# Patient Record
Sex: Female | Born: 1950 | Race: White | Hispanic: No | Marital: Married | Smoking: Never smoker
Health system: Southern US, Community
[De-identification: ages and names within clinical notes are randomized; demographics above are authoritative.]

## PROBLEM LIST (undated history)

## (undated) DIAGNOSIS — N3020 Other chronic cystitis without hematuria: Secondary | ICD-10-CM

## (undated) DIAGNOSIS — K219 Gastro-esophageal reflux disease without esophagitis: Secondary | ICD-10-CM

## (undated) DIAGNOSIS — I8390 Asymptomatic varicose veins of unspecified lower extremity: Secondary | ICD-10-CM

## (undated) DIAGNOSIS — K5790 Diverticulosis of intestine, part unspecified, without perforation or abscess without bleeding: Secondary | ICD-10-CM

## (undated) DIAGNOSIS — L659 Nonscarring hair loss, unspecified: Secondary | ICD-10-CM

## (undated) DIAGNOSIS — G35 Multiple sclerosis: Secondary | ICD-10-CM

## (undated) DIAGNOSIS — I10 Essential (primary) hypertension: Secondary | ICD-10-CM

## (undated) DIAGNOSIS — M1990 Unspecified osteoarthritis, unspecified site: Secondary | ICD-10-CM

## (undated) HISTORY — DX: Gastro-esophageal reflux disease without esophagitis: K21.9

## (undated) HISTORY — DX: Essential (primary) hypertension: I10

## (undated) HISTORY — DX: Asymptomatic varicose veins of unspecified lower extremity: I83.90

## (undated) HISTORY — PX: SHOULDER SURGERY: SHX246

## (undated) HISTORY — DX: Other chronic cystitis without hematuria: N30.20

## (undated) HISTORY — DX: Multiple sclerosis: G35

## (undated) HISTORY — DX: Nonscarring hair loss, unspecified: L65.9

## (undated) HISTORY — DX: Diverticulosis of intestine, part unspecified, without perforation or abscess without bleeding: K57.90

---

## 1975-02-03 HISTORY — PX: TUBAL LIGATION: SHX77

## 1985-02-02 HISTORY — PX: CHOLECYSTECTOMY: SHX55

## 1992-02-03 HISTORY — PX: ABDOMINAL HYSTERECTOMY: SHX81

## 1998-09-10 ENCOUNTER — Other Ambulatory Visit: Admission: RE | Admit: 1998-09-10 | Discharge: 1998-09-10 | Payer: Self-pay | Admitting: Obstetrics and Gynecology

## 2000-01-15 ENCOUNTER — Other Ambulatory Visit: Admission: RE | Admit: 2000-01-15 | Discharge: 2000-01-15 | Payer: Self-pay | Admitting: Obstetrics and Gynecology

## 2000-02-03 HISTORY — PX: FRACTURE SURGERY: SHX138

## 2001-06-15 ENCOUNTER — Other Ambulatory Visit: Admission: RE | Admit: 2001-06-15 | Discharge: 2001-06-15 | Payer: Self-pay | Admitting: Obstetrics and Gynecology

## 2002-02-02 HISTORY — PX: OTHER SURGICAL HISTORY: SHX169

## 2002-07-05 ENCOUNTER — Other Ambulatory Visit: Admission: RE | Admit: 2002-07-05 | Discharge: 2002-07-05 | Payer: Self-pay | Admitting: Obstetrics and Gynecology

## 2003-11-06 ENCOUNTER — Ambulatory Visit (HOSPITAL_COMMUNITY): Admission: RE | Admit: 2003-11-06 | Discharge: 2003-11-06 | Payer: Self-pay | Admitting: Obstetrics and Gynecology

## 2009-02-15 ENCOUNTER — Ambulatory Visit: Payer: Self-pay | Admitting: Genetic Counselor

## 2009-03-18 ENCOUNTER — Ambulatory Visit: Payer: Self-pay | Admitting: Genetic Counselor

## 2009-08-02 ENCOUNTER — Ambulatory Visit: Payer: Self-pay | Admitting: Genetic Counselor

## 2011-02-07 DIAGNOSIS — R197 Diarrhea, unspecified: Secondary | ICD-10-CM | POA: Diagnosis not present

## 2011-02-07 DIAGNOSIS — R112 Nausea with vomiting, unspecified: Secondary | ICD-10-CM | POA: Diagnosis not present

## 2011-02-07 DIAGNOSIS — N390 Urinary tract infection, site not specified: Secondary | ICD-10-CM | POA: Diagnosis not present

## 2011-02-07 DIAGNOSIS — R109 Unspecified abdominal pain: Secondary | ICD-10-CM | POA: Diagnosis not present

## 2011-02-07 DIAGNOSIS — G35 Multiple sclerosis: Secondary | ICD-10-CM | POA: Diagnosis not present

## 2011-02-07 DIAGNOSIS — R51 Headache: Secondary | ICD-10-CM | POA: Diagnosis not present

## 2011-02-07 DIAGNOSIS — E785 Hyperlipidemia, unspecified: Secondary | ICD-10-CM | POA: Diagnosis not present

## 2011-04-06 DIAGNOSIS — G35 Multiple sclerosis: Secondary | ICD-10-CM | POA: Diagnosis not present

## 2012-06-24 ENCOUNTER — Encounter: Payer: Self-pay | Admitting: Surgery

## 2012-06-24 ENCOUNTER — Other Ambulatory Visit: Payer: Self-pay | Admitting: *Deleted

## 2012-06-24 DIAGNOSIS — I83893 Varicose veins of bilateral lower extremities with other complications: Secondary | ICD-10-CM

## 2012-07-13 DIAGNOSIS — I10 Essential (primary) hypertension: Secondary | ICD-10-CM | POA: Diagnosis not present

## 2012-07-13 DIAGNOSIS — G35 Multiple sclerosis: Secondary | ICD-10-CM | POA: Diagnosis not present

## 2012-07-13 DIAGNOSIS — Z006 Encounter for examination for normal comparison and control in clinical research program: Secondary | ICD-10-CM | POA: Diagnosis not present

## 2012-08-08 ENCOUNTER — Encounter: Payer: Self-pay | Admitting: Surgery

## 2012-08-26 ENCOUNTER — Encounter: Payer: Self-pay | Admitting: Surgery

## 2012-08-29 ENCOUNTER — Encounter: Payer: Self-pay | Admitting: Surgery

## 2012-08-29 ENCOUNTER — Encounter (INDEPENDENT_AMBULATORY_CARE_PROVIDER_SITE_OTHER): Payer: Medicare Other | Admitting: *Deleted

## 2012-08-29 ENCOUNTER — Ambulatory Visit (INDEPENDENT_AMBULATORY_CARE_PROVIDER_SITE_OTHER): Payer: Medicare Other | Admitting: Surgery

## 2012-08-29 VITALS — BP 118/70 | HR 65 | Resp 20 | Ht 65.5 in | Wt 211.2 lb

## 2012-08-29 DIAGNOSIS — I83893 Varicose veins of bilateral lower extremities with other complications: Secondary | ICD-10-CM

## 2012-08-29 NOTE — Progress Notes (Signed)
[facility]   Patient name: Carol Green MRN: [id number] DOB: 21-Sep-1950 Sex: female   Referred by: Self  Reason for referral:  Chief Complaint  Patient presents with  . New Evaluation    c/o varicose veins with leg pain ; left worse than right    HISTORY OF PRESENT ILLNESS: This is a 62 year old female who comes in today at the request of Dr. Welton Flakes for evaluation of bilateral leg pain, left greater than right. She states that this is been going on for approximately one year and getting worse. She states that her legs feel very heavy and that they do not hurt with standing. Her symptoms are improved by sitting down and elevating her legs. She has noticed more prominent bulging veins on her leg. She is prone to falls secondary to her am asked and she is afraid of bleeding. She does complain of some swelling with prolonged standing. She denies a history of DVT or PE.  Past Medical History  Diagnosis Date  . Multiple sclerosis   . Hypertension   . Acid reflux   . Diverticulosis   . Chronic cystitis   . Alopecia   . Varicose veins     Past Surgical History  Procedure Laterality Date  . Cholecystectomy  1987  . Abdominal hysterectomy  1994  . Shoulder surgery Bilateral 2003 - right; 2008 - left  . Other surgical history Left 2004    removal fatty tumor left arm  . Tubal ligation  1977  . Fracture surgery Left 2002    History   Social History  . Marital Status: Single    Spouse Name: N/A    Number of Children: N/A  . Years of Education: N/A   Occupational History  . Not on file.   Social History Main Topics  . Smoking status: Never Smoker   . Smokeless tobacco: Not on file  . Alcohol Use: No  . Drug Use: No  . Sexually Active: Not on file   Other Topics Concern  . Not on file   Social History Narrative  . No narrative on file    Family History  Problem Relation Age of Onset  . Cancer Mother     ovarian  . Other Father    varicose veins  . Deep vein thrombosis Father   . Cancer Sister     breast  . Deep vein thrombosis Brother   . Other Brother     varicose veins  . Diabetes Sister   . Deep vein thrombosis Sister   . Hypertension Sister   . Hyperlipidemia Sister   . Other Sister     varicose veins    Allergies as of 08/29/2012 - Review Complete 08/29/2012  Allergen Reaction Noted  . Codeine  06/24/2012    Current Outpatient Prescriptions on File Prior to Visit  Medication Sig Dispense Refill  . amantadine (SYMMETREL) 100 MG capsule Take 100 mg by mouth 3 (three) times daily.      Marland Kitchen aspirin 81 MG tablet Take 81 mg by mouth. Take 2 tablets daily      . Calcium Carbonate Antacid (TUMS PO) Take by mouth daily.      Marland Kitchen gabapentin (NEURONTIN) 300 MG capsule Take 600 mg by mouth 4 (four) times daily.       Marland Kitchen lisinopril-hydrochlorothiazide (PRINZIDE,ZESTORETIC) 10-12.5 MG per tablet Take 1 tablet by mouth daily.      . Multiple Vitamins-Minerals (MULTIPLE VITAMINS/WOMENS PO) Take by mouth  daily.      . omeprazole (PRILOSEC) 20 MG capsule Take 20 mg by mouth daily.      . solifenacin (VESICARE) 5 MG tablet Take 5 mg by mouth daily.       . temazepam (RESTORIL) 15 MG capsule Take 15 mg by mouth at bedtime as needed for sleep.      . Vitamins C E (CRANBERRY CONCENTRATE PO) Take 100 mg by mouth 2 (two) times daily.        No current facility-administered medications on file prior to visit.     REVIEW OF SYSTEMS: Cardiovascular: Positive for pain in legs with walking and when lying flat. Positive for leg swelling and varicose veins Pulmonary: No productive cough, asthma or wheezing. Neurologic: Positive for weakness in arms and legs, numbness in her arms and legs, difficulty speaking and slurred speech, dizziness Hematologic: No bleeding problems or clotting disorders. Musculoskeletal: No joint pain or joint swelling. Gastrointestinal: No blood in stool or hematemesis Genitourinary: No dysuria or  hematuria. Psychiatric:: No history of major depression. Integumentary: No rashes or ulcers. Constitutional: No fever or chills.  PHYSICAL EXAMINATION: General: The patient appears their stated age.  Vital signs are BP 118/70  Pulse 65  Resp 20  Ht 5' 5.5" (1.664 m)  Wt 211 lb 3.2 oz (95.8 kg)  BMI 34.6 kg/m2 HEENT:  No gross abnormalities Pulmonary: Respirations are non-labored Musculoskeletal: There are no major deformities.   Neurologic: No focal weakness or paresthesias are detected, Skin: There are no ulcer or rashes noted. Psychiatric: The patient has normal affect. Cardiovascular: Palpable pedal pulses. Prominent varicosities in the left leg anteriorly around the knee and thigh as well as laterally  Diagnostic Studies: Duplex ultrasound was ordered and reviewed. This shows no significant reflux in the right leg however rouloux formation is noted. The left leg there is reflux in the great saphenous vein. Vein measurements are 0.45 and greater. There is no deep reflux bilaterally   Assessment:  Venous insufficiency, left leg Plan: The patient has symptoms of pain and mild swelling in her left leg which were secondary to her venous insufficiency. She is very concerned that the varicosities on her legs are getting bigger and that she may fall and because these to start bleeding secondary to her and asked. I'm going to place her in 2 thigh-high 20-30 mm compression stockings today impression her back in 3 months for possible laser ablation of the left great saphenous vein with stab phlebectomy of the varicosities.     Jorge Ny, M.D. [facility] [place] Office: [phone] Pager:  [phone]

## 2012-11-29 ENCOUNTER — Encounter: Payer: Self-pay | Admitting: Vascular Surgery

## 2012-11-29 ENCOUNTER — Ambulatory Visit (INDEPENDENT_AMBULATORY_CARE_PROVIDER_SITE_OTHER): Payer: Medicare Other | Admitting: Vascular Surgery

## 2012-11-29 VITALS — BP 136/65 | HR 82 | Ht 65.5 in | Wt 212.0 lb

## 2012-11-29 DIAGNOSIS — I83893 Varicose veins of bilateral lower extremities with other complications: Secondary | ICD-10-CM

## 2012-11-29 NOTE — Progress Notes (Signed)
Subjective:     Patient ID: Carol Green, female   DOB: 1950/11/04, 62 y.o.   MRN: [id number]  HPI this 62 year old female returns for further discussion regarding her severe varicose veins in the left leg. These cause pain including aching throbbing and burning discomfort. She has been trying long-leg elastic compression stockings 20-30 mm gradient as well as elevation and ibuprofen with no success. She has no history of DVT or thrombophlebitis. He has no history of stasis ulcers or bleeding.  Past Medical History  Diagnosis Date  . Multiple sclerosis   . Hypertension   . Acid reflux   . Diverticulosis   . Chronic cystitis   . Alopecia   . Varicose veins     History  Substance Use Topics  . Smoking status: Never Smoker   . Smokeless tobacco: Not on file  . Alcohol Use: No    Family History  Problem Relation Age of Onset  . Cancer Mother     ovarian  . Other Father     varicose veins  . Deep vein thrombosis Father   . Cancer Sister     breast  . Deep vein thrombosis Brother   . Other Brother     varicose veins  . Diabetes Sister   . Deep vein thrombosis Sister   . Hypertension Sister   . Hyperlipidemia Sister   . Other Sister     varicose veins    Allergies  Allergen Reactions  . Codeine     Current outpatient prescriptions:amantadine (SYMMETREL) 100 MG capsule, Take 100 mg by mouth 3 (three) times daily., Disp: , Rfl: ;  aspirin 81 MG tablet, Take 81 mg by mouth. Take 2 tablets daily, Disp: , Rfl: ;  Calcium Carbonate Antacid (TUMS PO), Take by mouth daily., Disp: , Rfl: ;  cetirizine (ZYRTEC) 10 MG tablet, Take 10 mg by mouth daily., Disp: , Rfl:  cholecalciferol (VITAMIN D) 1000 UNITS tablet, Take 1,000 Units by mouth daily., Disp: , Rfl: ;  Fingolimod HCl 0.5 MG CAPS, Take 1 capsule by mouth daily., Disp: , Rfl: ;  gabapentin (NEURONTIN) 300 MG capsule, Take 600 mg by mouth 4 (four) times daily. , Disp: , Rfl: ;  lisinopril-hydrochlorothiazide  (PRINZIDE,ZESTORETIC) 10-12.5 MG per tablet, Take 1 tablet by mouth daily., Disp: , Rfl:  Multiple Vitamins-Minerals (MULTIPLE VITAMINS/WOMENS PO), Take by mouth daily., Disp: , Rfl: ;  omeprazole (PRILOSEC) 20 MG capsule, Take 20 mg by mouth daily., Disp: , Rfl: ;  temazepam (RESTORIL) 15 MG capsule, Take 15 mg by mouth at bedtime as needed for sleep., Disp: , Rfl: ;  Vitamins C E (CRANBERRY CONCENTRATE PO), Take 100 mg by mouth 2 (two) times daily. , Disp: , Rfl:  celecoxib (CELEBREX) 200 MG capsule, Take 200 mg by mouth daily., Disp: , Rfl: ;  promethazine (PHENERGAN) 12.5 MG tablet, Take 12.5 mg by mouth every 6 (six) hours as needed for nausea., Disp: , Rfl: ;  solifenacin (VESICARE) 5 MG tablet, Take 5 mg by mouth daily. , Disp: , Rfl:   BP 136/65  Pulse 82  Ht 5' 5.5" (1.664 m)  Wt 212 lb (96.163 kg)  BMI 34.73 kg/m2  SpO2 95%  Body mass index is 34.73 kg/(m^2).           Review of Systems denies chest pain, dyspnea on exertion, PND, orthopnea, hemoptysis. Does complain of weakness in her legs, numbness in her legs, dizziness, and occasional slurred speech.     Objective:  Physical Exam BP 136/65  Pulse 82  Ht 5' 5.5" (1.664 m)  Wt 212 lb (96.163 kg)  BMI 34.73 kg/m2  SpO2 95%  General well-developed well-nourished female in no apparent distress alert and oriented x3 Left leg with bulging varicosities in the distal anterior medial thigh extending lateral to the patella and into the lateral calf. No active ulcers noted distally. 3+ dorsalis pedis pulse palpable distally.     Assessment:     Severe venous insufficiency left leg with bulging painful varicosities not relieved by long-leg elastic compression stockings, elevation, and ibuprofen.-Affecting patient's daily living and becoming worse patient needs #1 laser ablation left great saphenous vein. We'll then wait 3 months to see if stab phlebectomy of secondary painful varicosities will be indicated. Will proceed with  precertification to perform this in the near future     Plan:

## 2012-12-27 ENCOUNTER — Other Ambulatory Visit: Payer: Self-pay | Admitting: *Deleted

## 2012-12-27 DIAGNOSIS — I83893 Varicose veins of bilateral lower extremities with other complications: Secondary | ICD-10-CM

## 2013-02-06 ENCOUNTER — Other Ambulatory Visit: Payer: Medicare Other | Admitting: Vascular Surgery

## 2013-02-13 ENCOUNTER — Ambulatory Visit: Payer: Medicare Other | Admitting: Vascular Surgery

## 2013-02-13 ENCOUNTER — Encounter (HOSPITAL_COMMUNITY): Payer: Medicare Other

## 2013-03-03 ENCOUNTER — Encounter: Payer: Self-pay | Admitting: Vascular Surgery

## 2013-03-06 ENCOUNTER — Ambulatory Visit (INDEPENDENT_AMBULATORY_CARE_PROVIDER_SITE_OTHER): Payer: Medicare Other | Admitting: Vascular Surgery

## 2013-03-06 ENCOUNTER — Encounter: Payer: Self-pay | Admitting: Vascular Surgery

## 2013-03-06 VITALS — BP 152/95 | HR 90 | Resp 16 | Ht 65.5 in | Wt 213.0 lb

## 2013-03-06 DIAGNOSIS — I83893 Varicose veins of bilateral lower extremities with other complications: Secondary | ICD-10-CM

## 2013-03-06 NOTE — Progress Notes (Signed)
Subjective:     Patient ID: Carol Green, female   DOB: July 16, 1950, 63 y.o.   MRN: [id number]  HPI this 63 year old female had laser ablation left great saphenous vein from the proximal calf to near the saphenofemoral junction performed under local tumescent anesthesia for painful varicosities. She tolerated the procedure well. A total of 2264 J of energy was utilized   Review of Systems     Objective:   Physical Exam BP 152/95  Pulse 90  Resp 16  Ht 5' 5.5" (1.664 m)  Wt 213 lb (96.616 kg)  BMI 34.89 kg/m2       Assessment:     Well-tolerated laser ablation left great saphenous vein performed under local tumescent anesthesia for painful varicosities    Plan:     Return in one week for a venous duplex exam to confirm closure left great saphenous vein

## 2013-03-06 NOTE — Progress Notes (Signed)
   Laser Ablation Procedure      Date: 03/06/2013    Carol Green DOB:11-03-1950  Consent signed: Yes  Surgeon:J.D. Kellie Simmering  Procedure: Laser Ablation: left Greater Saphenous Vein  BP 152/95  Pulse 90  Resp 16  Ht 5' 5.5" (1.664 m)  Wt 213 lb (96.616 kg)  BMI 34.89 kg/m2  Start time: 11   End time: 11:30  Tumescent Anesthesia: 400 cc 0.9% NaCl with 50 cc Lidocaine HCL with 1% Epi and 15 cc 8.4% NaHCO3  Local Anesthesia: 5 cc Lidocaine HCL and NaHCO3 (ratio 2:1)  Pulsed mode: 15 watts, 587ms delay, 1.0 duration Total energy: 2264, total pulses: 152, total time: 2:31     Patient tolerated procedure well: Yes  Notes:   Description of Procedure:  After marking the course of the saphenous vein and the secondary varicosities in the standing position, the patient was placed on the operating table in the supine position, and the left leg was prepped and draped in sterile fashion. Local anesthetic was administered, and under ultrasound guidance the saphenous vein was accessed with a micro needle and guide wire; then the micro puncture sheath was placed. A guide wire was inserted to the saphenofemoral junction, followed by a 5 french sheath.  The position of the sheath and then the laser fiber below the junction was confirmed using the ultrasound and visualization of the aiming beam.  Tumescent anesthesia was administered along the course of the saphenous vein using ultrasound guidance. Protective laser glasses were placed on the patient, and the laser was fired at 15 watt pulsed mode advancing 1-2 mm per sec.  For a total of 2264 joules.  A steri strip was applied to the puncture site.    ABD pads and thigh high compression stockings were applied.  Ace wrap bandages were applied over the phlebectomy sites and at the top of the saphenofemoral junction.  Blood loss was less than 15 cc.  The patient ambulated out of the operating room having tolerated the procedure well.

## 2013-03-07 ENCOUNTER — Encounter: Payer: Self-pay | Admitting: Vascular Surgery

## 2013-03-07 ENCOUNTER — Telehealth: Payer: Self-pay | Admitting: *Deleted

## 2013-03-07 NOTE — Telephone Encounter (Signed)
Pt. Doing well. No problems. Following all instructions.

## 2013-03-10 ENCOUNTER — Encounter: Payer: Self-pay | Admitting: Vascular Surgery

## 2013-03-13 ENCOUNTER — Ambulatory Visit (INDEPENDENT_AMBULATORY_CARE_PROVIDER_SITE_OTHER): Payer: Self-pay | Admitting: Vascular Surgery

## 2013-03-13 ENCOUNTER — Ambulatory Visit (HOSPITAL_COMMUNITY)
Admission: RE | Admit: 2013-03-13 | Discharge: 2013-03-13 | Disposition: A | Discharge status: Home / Self Care | Payer: Medicare Other | Source: Ambulatory Visit | Attending: Vascular Surgery | Admitting: Vascular Surgery

## 2013-03-13 ENCOUNTER — Encounter: Payer: Self-pay | Admitting: Vascular Surgery

## 2013-03-13 VITALS — BP 156/87 | HR 89 | Resp 16 | Ht 65.5 in | Wt 211.0 lb

## 2013-03-13 DIAGNOSIS — I83893 Varicose veins of bilateral lower extremities with other complications: Secondary | ICD-10-CM

## 2013-03-13 DIAGNOSIS — Z09 Encounter for follow-up examination after completed treatment for conditions other than malignant neoplasm: Secondary | ICD-10-CM | POA: Insufficient documentation

## 2013-03-13 NOTE — Progress Notes (Signed)
Subjective:     Patient ID: Carol Green, female   DOB: 05/06/1950, 63 y.o.   MRN: [id number]  HPI this 63 year old female returns 1 week post laser ablation left great saphenous vein for painful varicosities due to gross reflux in the left great saphenous system. She states that the residual varicosities or less noticeable following the procedure. She had some moderate discomfort initially which is now resolving in the thigh area where the ablation was performed. She is wearing elastic compression stockings and taking ibuprofen as instructed.  Past Medical History  Diagnosis Date  . Multiple sclerosis   . Hypertension   . Acid reflux   . Diverticulosis   . Chronic cystitis   . Alopecia   . Varicose veins     History  Substance Use Topics  . Smoking status: Never Smoker   . Smokeless tobacco: Not on file  . Alcohol Use: No    Family History  Problem Relation Age of Onset  . Cancer Mother     ovarian  . Other Father     varicose veins  . Deep vein thrombosis Father   . Cancer Sister     breast  . Deep vein thrombosis Brother   . Other Brother     varicose veins  . Diabetes Sister   . Deep vein thrombosis Sister   . Hypertension Sister   . Hyperlipidemia Sister   . Other Sister     varicose veins    Allergies  Allergen Reactions  . Codeine     Current outpatient prescriptions:amantadine (SYMMETREL) 100 MG capsule, Take 100 mg by mouth 3 (three) times daily., Disp: , Rfl: ;  aspirin 81 MG tablet, Take 81 mg by mouth. Take 2 tablets daily, Disp: , Rfl: ;  Calcium Carbonate Antacid (TUMS PO), Take by mouth daily., Disp: , Rfl: ;  celecoxib (CELEBREX) 200 MG capsule, Take 200 mg by mouth daily., Disp: , Rfl: ;  cetirizine (ZYRTEC) 10 MG tablet, Take 10 mg by mouth daily., Disp: , Rfl:  cholecalciferol (VITAMIN D) 1000 UNITS tablet, Take 1,000 Units by mouth daily., Disp: , Rfl: ;  Fingolimod HCl 0.5 MG CAPS, Take 1 capsule by mouth daily., Disp: , Rfl: ;  gabapentin  (NEURONTIN) 300 MG capsule, Take 600 mg by mouth 4 (four) times daily. , Disp: , Rfl: ;  lisinopril-hydrochlorothiazide (PRINZIDE,ZESTORETIC) 10-12.5 MG per tablet, Take 1 tablet by mouth daily., Disp: , Rfl:  Multiple Vitamins-Minerals (MULTIPLE VITAMINS/WOMENS PO), Take by mouth daily., Disp: , Rfl: ;  omeprazole (PRILOSEC) 20 MG capsule, Take 20 mg by mouth daily., Disp: , Rfl: ;  promethazine (PHENERGAN) 12.5 MG tablet, Take 12.5 mg by mouth every 6 (six) hours as needed for nausea., Disp: , Rfl: ;  solifenacin (VESICARE) 5 MG tablet, Take 5 mg by mouth daily. , Disp: , Rfl:  temazepam (RESTORIL) 15 MG capsule, Take 15 mg by mouth at bedtime as needed for sleep., Disp: , Rfl: ;  Vitamins C E (CRANBERRY CONCENTRATE PO), Take 100 mg by mouth 2 (two) times daily. , Disp: , Rfl:   BP 156/87  Pulse 89  Resp 16  Ht 5' 5.5" (1.664 m)  Wt 211 lb (95.709 kg)  BMI 34.57 kg/m2  Body mass index is 34.57 kg/(m^2).          Review of Systems denies chest pain, dyspnea on exertion, PND, orthopnea, hemoptysis, claudication    Objective:   Physical Exam BP 156/87  Pulse 89  Resp 16  Ht 5' 5.5" (1.664 m)  Wt 211 lb (95.709 kg)  BMI 34.57 kg/m2  General well-developed well-nourished female in no apparent stress alert and oriented x3 Lungs no rhonchi or wheezing Left leg with mild to moderate discomfort along coarser great saphenous vein from groin to distal thigh. There is a prominent varix across the patella and a few in the distal medial thigh area. 3 posterior cells pedis pulse palpable.  Today I ordered a venous duplex exam the left leg which are reviewed and interpreted. There is no DVT. The left great saphenous vein was closed from the proximal calf to near the saphenofemoral junction     Assessment:     Successful laser ablation left great saphenous vein for painful varicosities due to gross reflux left great saphenous system    Plan:     Return in 3 months to see if stab  phlebectomy or sclerotherapy will be needed

## 2013-03-15 ENCOUNTER — Other Ambulatory Visit: Payer: Self-pay | Admitting: Vascular Surgery

## 2013-03-15 DIAGNOSIS — I83893 Varicose veins of bilateral lower extremities with other complications: Secondary | ICD-10-CM

## 2013-06-19 ENCOUNTER — Encounter: Payer: Self-pay | Admitting: Vascular Surgery

## 2013-06-20 ENCOUNTER — Ambulatory Visit: Payer: Medicare Other | Admitting: Vascular Surgery

## 2013-06-23 ENCOUNTER — Encounter: Payer: Self-pay | Admitting: Vascular Surgery

## 2013-06-27 ENCOUNTER — Encounter: Payer: Self-pay | Admitting: Vascular Surgery

## 2013-06-27 ENCOUNTER — Ambulatory Visit (INDEPENDENT_AMBULATORY_CARE_PROVIDER_SITE_OTHER): Payer: Medicare Other | Admitting: Vascular Surgery

## 2013-06-27 VITALS — BP 139/65 | HR 84 | Resp 16 | Ht 65.5 in | Wt 212.0 lb

## 2013-06-27 DIAGNOSIS — I83893 Varicose veins of bilateral lower extremities with other complications: Secondary | ICD-10-CM

## 2013-06-27 NOTE — Progress Notes (Signed)
Subjective:     Patient ID: Carol Green, female   DOB: 03/07/1950, 63 y.o.   MRN: [id number]  HPI this 63 year old female returns 3 months post laser ablation left great saphenous vein for painful varicosities. She states that the swelling in the ankle it has disappeared and the pain in the bop varicosities is less but not resolved. She continues to have some residual varicosities in the lateral leg around the knee area. She has tried longer-lasting compression stockings and this has not relieved her discomfort.  Past Medical History  Diagnosis Date  . Multiple sclerosis   . Hypertension   . Acid reflux   . Diverticulosis   . Chronic cystitis   . Alopecia   . Varicose veins     History  Substance Use Topics  . Smoking status: Never Smoker   . Smokeless tobacco: Not on file  . Alcohol Use: No    Family History  Problem Relation Age of Onset  . Cancer Mother     ovarian  . Other Father     varicose veins  . Deep vein thrombosis Father   . Cancer Sister     breast  . Deep vein thrombosis Brother   . Other Brother     varicose veins  . Diabetes Sister   . Deep vein thrombosis Sister   . Hypertension Sister   . Hyperlipidemia Sister   . Other Sister     varicose veins    Allergies  Allergen Reactions  . Codeine     Current outpatient prescriptions:amantadine (SYMMETREL) 100 MG capsule, Take 100 mg by mouth 3 (three) times daily., Disp: , Rfl: ;  aspirin 81 MG tablet, Take 81 mg by mouth. Take 2 tablets daily, Disp: , Rfl: ;  Calcium Carbonate Antacid (TUMS PO), Take by mouth daily., Disp: , Rfl: ;  celecoxib (CELEBREX) 200 MG capsule, Take 200 mg by mouth daily., Disp: , Rfl: ;  cetirizine (ZYRTEC) 10 MG tablet, Take 10 mg by mouth daily., Disp: , Rfl:  cholecalciferol (VITAMIN D) 1000 UNITS tablet, Take 1,000 Units by mouth daily., Disp: , Rfl: ;  Fingolimod HCl 0.5 MG CAPS, Take 1 capsule by mouth daily., Disp: , Rfl: ;  gabapentin (NEURONTIN) 300 MG capsule, Take 600  mg by mouth 4 (four) times daily. , Disp: , Rfl: ;  lisinopril-hydrochlorothiazide (PRINZIDE,ZESTORETIC) 10-12.5 MG per tablet, Take 1 tablet by mouth daily., Disp: , Rfl:  Multiple Vitamins-Minerals (MULTIPLE VITAMINS/WOMENS PO), Take by mouth daily., Disp: , Rfl: ;  omeprazole (PRILOSEC) 20 MG capsule, Take 20 mg by mouth daily., Disp: , Rfl: ;  promethazine (PHENERGAN) 12.5 MG tablet, Take 12.5 mg by mouth every 6 (six) hours as needed for nausea., Disp: , Rfl: ;  solifenacin (VESICARE) 5 MG tablet, Take 5 mg by mouth daily. , Disp: , Rfl:  temazepam (RESTORIL) 15 MG capsule, Take 15 mg by mouth at bedtime as needed for sleep., Disp: , Rfl: ;  Vitamins C E (CRANBERRY CONCENTRATE PO), Take 100 mg by mouth 2 (two) times daily. , Disp: , Rfl:   BP 139/65  Pulse 84  Resp 16  Ht 5' 5.5" (1.664 m)  Wt 212 lb (96.163 kg)  BMI 34.73 kg/m2  Body mass index is 34.73 kg/(m^2).           Review of Systems denies chest pain, dyspnea on exertion, PND, orthopnea, hemoptysis.     Objective:   Physical Exam BP 139/65  Pulse 84  Resp 16  Ht 5' 5.5" (1.664 m)  Wt 212 lb (96.163 kg)  BMI 34.73 kg/m2  General well-developed well-nourished female no apparent stress alert and oriented x3 Lungs no rhonchi or wheezing Left leg with no distal edema noted. 3 posterior cells pedis pulse palpable. Prominent vein over Patel item pretibial region. Adiposities and distal thigh anteriorly and lateral thigh and lateral calf.      Assessment:     Painful varicosities left leg 3 months post left great saphenous vein laser ablation-successful    Plan:     Patient needs greater than 20 stab phlebectomy painful varicosities left leg is final procedure We'll proceed with precertification to perform this in the near future to relieve her pain

## 2013-07-14 ENCOUNTER — Encounter: Payer: Self-pay | Admitting: Vascular Surgery

## 2013-07-17 ENCOUNTER — Encounter: Payer: Self-pay | Admitting: Vascular Surgery

## 2013-07-17 ENCOUNTER — Ambulatory Visit (INDEPENDENT_AMBULATORY_CARE_PROVIDER_SITE_OTHER): Payer: Medicare Other | Admitting: Vascular Surgery

## 2013-07-17 VITALS — BP 126/79 | HR 85 | Resp 16 | Ht 65.5 in | Wt 212.0 lb

## 2013-07-17 DIAGNOSIS — I83893 Varicose veins of bilateral lower extremities with other complications: Secondary | ICD-10-CM

## 2013-07-17 NOTE — Progress Notes (Signed)
Subjective:     Patient ID: Carol Green, female   DOB: [birth year], 63 y.o.   MRN: [id number]  HPI this 63 year old female had multiple stab phlebectomy left leg secondary painful varicosities performed under local tumescent anesthesia. She tolerated surgery well.   Review of Systems     Objective:   Physical Exam BP 126/79  Pulse 85  Resp 16  Ht 5' 5.5" (1.664 m)  Wt 212 lb (96.163 kg)  BMI 34.73 kg/m2       Assessment:     Well-tolerated multiple stab phlebectomy (greater than 20) of painful varicosities performed under local tumescent anesthesia    Plan:     Return in 8 weeks for final followup

## 2013-07-17 NOTE — Progress Notes (Signed)
   Laser Ablation Procedure      Date: 07/17/2013    Carol Green DOB:[birth year]  Consent signed: Yes  Surgeon:J.D. Kellie Simmering  Procedure: : left Leg stab phlebectomies  BP 126/79  Pulse 85  Resp 16  Ht 5' 5.5" (1.664 m)  Wt 212 lb (96.163 kg)  BMI 34.73 kg/m2  Start time: 10:55   End time: 11:30  Tumescent Anesthesia: 200 cc 0.9% NaCl with 50 cc Lidocaine HCL with 1% Epi and 15 cc 8.4% NaHCO3  Local Anesthesia: 6 cc Lidocaine HCL and NaHCO3 (ratio 2:1)      Stab Phlebectomy: >20 Sites: Thigh, Calf and Ankle  Patient tolerated procedure well: Yes  Notes:   Description of Procedure:   The patient was then put into Trendelenburg position.  Local anesthetic was utilized overlying the marked varicosities.  Greater than 20 stab wounds were made using the tip of an 11 blade; and using the vein hook,  The phlebectomies were performed using a hemostat to avulse these varicosities.  Adequate hemostasis was achieved, and steri strips were applied to the stab wound.      ABD pads and thigh high compression stockings were applied.  Ace wrap bandages were applied over the phlebectomy sites.  Blood loss was less than 15 cc.  The patient ambulated out of the operating room having tolerated the procedure well.

## 2013-07-18 ENCOUNTER — Telehealth: Payer: Self-pay | Admitting: *Deleted

## 2013-07-18 NOTE — Telephone Encounter (Signed)
Pt doing well. No pain or bleeding. Following all instructions.

## 2013-09-08 ENCOUNTER — Encounter: Payer: Self-pay | Admitting: Vascular Surgery

## 2013-09-11 ENCOUNTER — Encounter: Payer: Self-pay | Admitting: Vascular Surgery

## 2013-09-11 ENCOUNTER — Ambulatory Visit (INDEPENDENT_AMBULATORY_CARE_PROVIDER_SITE_OTHER): Payer: Self-pay | Admitting: Vascular Surgery

## 2013-09-11 VITALS — BP 144/81 | HR 85 | Resp 18 | Ht 65.5 in | Wt 213.0 lb

## 2013-09-11 DIAGNOSIS — I83893 Varicose veins of bilateral lower extremities with other complications: Secondary | ICD-10-CM

## 2013-09-11 NOTE — Progress Notes (Signed)
Subjective:     Patient ID: Carol Green, female   DOB: [birth year], 63 y.o.   MRN: [id number]  HPI this 63 year old female returns for final followup regarding her left leg varicose vein treatment. She had laser ablation left great saphenous vein followed by multiple stab phlebectomy of painful varicosities. She states that she is very pleased with no recurrent varicosities or residual varicosities noted and no distal edema   Review of Systems     Objective:   Physical Exam BP 144/81  Pulse 85  Resp 18  Ht 5' 5.5" (1.664 m)  Wt 213 lb (96.616 kg)  BMI 34.89 kg/m2  Left leg is free of any obvious varicosities. No edema noted. 3 posterior cells pedis pulse palpable. Stab phlebectomy sites healing nicely.     Assessment:      good result following greater saphenous laser ablation followed by multiple stab phlebectomy of painful varicosities     Plan:     Return to see [place] on when necessary basis

## 2014-02-13 DIAGNOSIS — H40003 Preglaucoma, unspecified, bilateral: Secondary | ICD-10-CM | POA: Diagnosis not present

## 2014-03-06 DIAGNOSIS — Z79899 Other long term (current) drug therapy: Secondary | ICD-10-CM | POA: Diagnosis not present

## 2014-03-06 DIAGNOSIS — K219 Gastro-esophageal reflux disease without esophagitis: Secondary | ICD-10-CM | POA: Diagnosis not present

## 2014-03-06 DIAGNOSIS — B351 Tinea unguium: Secondary | ICD-10-CM | POA: Diagnosis not present

## 2014-03-06 DIAGNOSIS — M545 Low back pain: Secondary | ICD-10-CM | POA: Diagnosis not present

## 2014-03-21 DIAGNOSIS — M542 Cervicalgia: Secondary | ICD-10-CM | POA: Diagnosis not present

## 2014-03-21 DIAGNOSIS — J069 Acute upper respiratory infection, unspecified: Secondary | ICD-10-CM | POA: Diagnosis not present

## 2014-03-21 DIAGNOSIS — G8929 Other chronic pain: Secondary | ICD-10-CM | POA: Diagnosis not present

## 2014-04-05 DIAGNOSIS — G35 Multiple sclerosis: Secondary | ICD-10-CM | POA: Diagnosis not present

## 2014-04-05 DIAGNOSIS — I10 Essential (primary) hypertension: Secondary | ICD-10-CM | POA: Diagnosis not present

## 2014-04-05 DIAGNOSIS — Z7982 Long term (current) use of aspirin: Secondary | ICD-10-CM | POA: Diagnosis not present

## 2014-04-05 DIAGNOSIS — R531 Weakness: Secondary | ICD-10-CM | POA: Diagnosis not present

## 2014-04-06 DIAGNOSIS — G35 Multiple sclerosis: Secondary | ICD-10-CM | POA: Diagnosis not present

## 2014-04-06 DIAGNOSIS — I10 Essential (primary) hypertension: Secondary | ICD-10-CM | POA: Diagnosis not present

## 2014-04-07 DIAGNOSIS — G35 Multiple sclerosis: Secondary | ICD-10-CM | POA: Diagnosis not present

## 2014-04-08 DIAGNOSIS — G35 Multiple sclerosis: Secondary | ICD-10-CM | POA: Diagnosis not present

## 2014-04-09 DIAGNOSIS — G35 Multiple sclerosis: Secondary | ICD-10-CM | POA: Diagnosis not present

## 2014-05-04 DIAGNOSIS — M545 Low back pain: Secondary | ICD-10-CM | POA: Diagnosis not present

## 2014-06-18 DIAGNOSIS — D72819 Decreased white blood cell count, unspecified: Secondary | ICD-10-CM | POA: Diagnosis not present

## 2014-06-18 DIAGNOSIS — G35 Multiple sclerosis: Secondary | ICD-10-CM | POA: Diagnosis not present

## 2014-06-26 DIAGNOSIS — G35 Multiple sclerosis: Secondary | ICD-10-CM | POA: Diagnosis not present

## 2014-06-26 DIAGNOSIS — H538 Other visual disturbances: Secondary | ICD-10-CM | POA: Diagnosis not present

## 2014-06-26 DIAGNOSIS — R51 Headache: Secondary | ICD-10-CM | POA: Diagnosis not present

## 2014-06-26 DIAGNOSIS — R269 Unspecified abnormalities of gait and mobility: Secondary | ICD-10-CM | POA: Diagnosis not present

## 2014-07-05 DIAGNOSIS — G35 Multiple sclerosis: Secondary | ICD-10-CM | POA: Diagnosis not present

## 2014-08-08 DIAGNOSIS — M7021 Olecranon bursitis, right elbow: Secondary | ICD-10-CM | POA: Diagnosis not present

## 2014-08-08 DIAGNOSIS — L820 Inflamed seborrheic keratosis: Secondary | ICD-10-CM | POA: Diagnosis not present

## 2014-08-08 DIAGNOSIS — B372 Candidiasis of skin and nail: Secondary | ICD-10-CM | POA: Diagnosis not present

## 2014-08-08 DIAGNOSIS — B351 Tinea unguium: Secondary | ICD-10-CM | POA: Diagnosis not present

## 2014-08-14 DIAGNOSIS — H40003 Preglaucoma, unspecified, bilateral: Secondary | ICD-10-CM | POA: Diagnosis not present

## 2014-08-16 DIAGNOSIS — Z01419 Encounter for gynecological examination (general) (routine) without abnormal findings: Secondary | ICD-10-CM | POA: Diagnosis not present

## 2014-08-16 DIAGNOSIS — Z1231 Encounter for screening mammogram for malignant neoplasm of breast: Secondary | ICD-10-CM | POA: Diagnosis not present

## 2014-08-16 DIAGNOSIS — Z6835 Body mass index (BMI) 35.0-35.9, adult: Secondary | ICD-10-CM | POA: Diagnosis not present

## 2014-08-28 DIAGNOSIS — M19049 Primary osteoarthritis, unspecified hand: Secondary | ICD-10-CM | POA: Diagnosis not present

## 2014-08-28 DIAGNOSIS — G35 Multiple sclerosis: Secondary | ICD-10-CM | POA: Diagnosis not present

## 2014-08-28 DIAGNOSIS — B351 Tinea unguium: Secondary | ICD-10-CM | POA: Diagnosis not present

## 2014-09-04 DIAGNOSIS — H40003 Preglaucoma, unspecified, bilateral: Secondary | ICD-10-CM | POA: Diagnosis not present

## 2014-09-17 DIAGNOSIS — Z79899 Other long term (current) drug therapy: Secondary | ICD-10-CM | POA: Diagnosis not present

## 2014-09-17 DIAGNOSIS — E780 Pure hypercholesterolemia: Secondary | ICD-10-CM | POA: Diagnosis not present

## 2014-09-18 DIAGNOSIS — L600 Ingrowing nail: Secondary | ICD-10-CM | POA: Diagnosis not present

## 2014-10-02 DIAGNOSIS — L600 Ingrowing nail: Secondary | ICD-10-CM | POA: Diagnosis not present

## 2014-10-03 DIAGNOSIS — M7021 Olecranon bursitis, right elbow: Secondary | ICD-10-CM | POA: Diagnosis not present

## 2014-10-09 DIAGNOSIS — D72819 Decreased white blood cell count, unspecified: Secondary | ICD-10-CM | POA: Diagnosis not present

## 2014-10-09 DIAGNOSIS — G35 Multiple sclerosis: Secondary | ICD-10-CM | POA: Diagnosis not present

## 2014-10-31 DIAGNOSIS — M7021 Olecranon bursitis, right elbow: Secondary | ICD-10-CM | POA: Diagnosis not present

## 2014-11-27 DIAGNOSIS — E7800 Pure hypercholesterolemia, unspecified: Secondary | ICD-10-CM | POA: Diagnosis not present

## 2014-11-28 DIAGNOSIS — Z79899 Other long term (current) drug therapy: Secondary | ICD-10-CM | POA: Diagnosis not present

## 2014-11-28 DIAGNOSIS — M542 Cervicalgia: Secondary | ICD-10-CM | POA: Diagnosis not present

## 2014-11-28 DIAGNOSIS — M25512 Pain in left shoulder: Secondary | ICD-10-CM | POA: Diagnosis not present

## 2014-11-28 DIAGNOSIS — M2550 Pain in unspecified joint: Secondary | ICD-10-CM | POA: Diagnosis not present

## 2014-11-28 DIAGNOSIS — M79642 Pain in left hand: Secondary | ICD-10-CM | POA: Diagnosis not present

## 2014-11-28 DIAGNOSIS — M545 Low back pain: Secondary | ICD-10-CM | POA: Diagnosis not present

## 2014-11-28 DIAGNOSIS — M79641 Pain in right hand: Secondary | ICD-10-CM | POA: Diagnosis not present

## 2014-11-28 DIAGNOSIS — M25511 Pain in right shoulder: Secondary | ICD-10-CM | POA: Diagnosis not present

## 2014-12-10 DIAGNOSIS — D485 Neoplasm of uncertain behavior of skin: Secondary | ICD-10-CM | POA: Diagnosis not present

## 2014-12-24 DIAGNOSIS — D485 Neoplasm of uncertain behavior of skin: Secondary | ICD-10-CM | POA: Diagnosis not present

## 2014-12-25 DIAGNOSIS — L988 Other specified disorders of the skin and subcutaneous tissue: Secondary | ICD-10-CM | POA: Diagnosis not present

## 2014-12-25 DIAGNOSIS — D485 Neoplasm of uncertain behavior of skin: Secondary | ICD-10-CM | POA: Diagnosis not present

## 2014-12-26 DIAGNOSIS — M79641 Pain in right hand: Secondary | ICD-10-CM | POA: Diagnosis not present

## 2014-12-26 DIAGNOSIS — M79642 Pain in left hand: Secondary | ICD-10-CM | POA: Diagnosis not present

## 2014-12-31 DIAGNOSIS — L02413 Cutaneous abscess of right upper limb: Secondary | ICD-10-CM | POA: Diagnosis not present

## 2015-01-02 DIAGNOSIS — M545 Low back pain: Secondary | ICD-10-CM | POA: Diagnosis not present

## 2015-01-02 DIAGNOSIS — H40013 Open angle with borderline findings, low risk, bilateral: Secondary | ICD-10-CM | POA: Diagnosis not present

## 2015-01-02 DIAGNOSIS — M542 Cervicalgia: Secondary | ICD-10-CM | POA: Diagnosis not present

## 2015-01-07 DIAGNOSIS — M542 Cervicalgia: Secondary | ICD-10-CM | POA: Diagnosis not present

## 2015-01-07 DIAGNOSIS — M545 Low back pain: Secondary | ICD-10-CM | POA: Diagnosis not present

## 2015-01-10 DIAGNOSIS — M545 Low back pain: Secondary | ICD-10-CM | POA: Diagnosis not present

## 2015-01-10 DIAGNOSIS — M542 Cervicalgia: Secondary | ICD-10-CM | POA: Diagnosis not present

## 2015-01-14 DIAGNOSIS — M542 Cervicalgia: Secondary | ICD-10-CM | POA: Diagnosis not present

## 2015-01-14 DIAGNOSIS — M545 Low back pain: Secondary | ICD-10-CM | POA: Diagnosis not present

## 2015-01-16 DIAGNOSIS — M4056 Lordosis, unspecified, lumbar region: Secondary | ICD-10-CM | POA: Diagnosis not present

## 2015-01-16 DIAGNOSIS — M19041 Primary osteoarthritis, right hand: Secondary | ICD-10-CM | POA: Diagnosis not present

## 2015-01-16 DIAGNOSIS — M4302 Spondylolysis, cervical region: Secondary | ICD-10-CM | POA: Diagnosis not present

## 2015-01-22 DIAGNOSIS — M545 Low back pain: Secondary | ICD-10-CM | POA: Diagnosis not present

## 2015-01-22 DIAGNOSIS — M542 Cervicalgia: Secondary | ICD-10-CM | POA: Diagnosis not present

## 2015-01-30 DIAGNOSIS — M542 Cervicalgia: Secondary | ICD-10-CM | POA: Diagnosis not present

## 2015-01-30 DIAGNOSIS — M545 Low back pain: Secondary | ICD-10-CM | POA: Diagnosis not present

## 2015-02-07 DIAGNOSIS — M542 Cervicalgia: Secondary | ICD-10-CM | POA: Diagnosis not present

## 2015-02-07 DIAGNOSIS — M545 Low back pain: Secondary | ICD-10-CM | POA: Diagnosis not present

## 2015-02-14 DIAGNOSIS — M542 Cervicalgia: Secondary | ICD-10-CM | POA: Diagnosis not present

## 2015-02-14 DIAGNOSIS — M545 Low back pain: Secondary | ICD-10-CM | POA: Diagnosis not present

## 2015-02-19 DIAGNOSIS — M542 Cervicalgia: Secondary | ICD-10-CM | POA: Diagnosis not present

## 2015-02-19 DIAGNOSIS — M545 Low back pain: Secondary | ICD-10-CM | POA: Diagnosis not present

## 2015-02-21 DIAGNOSIS — M542 Cervicalgia: Secondary | ICD-10-CM | POA: Diagnosis not present

## 2015-02-21 DIAGNOSIS — M545 Low back pain: Secondary | ICD-10-CM | POA: Diagnosis not present

## 2015-02-26 DIAGNOSIS — M542 Cervicalgia: Secondary | ICD-10-CM | POA: Diagnosis not present

## 2015-02-26 DIAGNOSIS — M545 Low back pain: Secondary | ICD-10-CM | POA: Diagnosis not present

## 2015-02-28 DIAGNOSIS — M542 Cervicalgia: Secondary | ICD-10-CM | POA: Diagnosis not present

## 2015-02-28 DIAGNOSIS — M545 Low back pain: Secondary | ICD-10-CM | POA: Diagnosis not present

## 2015-03-05 DIAGNOSIS — M545 Low back pain: Secondary | ICD-10-CM | POA: Diagnosis not present

## 2015-03-05 DIAGNOSIS — M542 Cervicalgia: Secondary | ICD-10-CM | POA: Diagnosis not present

## 2015-03-08 DIAGNOSIS — M542 Cervicalgia: Secondary | ICD-10-CM | POA: Diagnosis not present

## 2015-03-08 DIAGNOSIS — M545 Low back pain: Secondary | ICD-10-CM | POA: Diagnosis not present

## 2015-03-12 DIAGNOSIS — M545 Low back pain: Secondary | ICD-10-CM | POA: Diagnosis not present

## 2015-03-12 DIAGNOSIS — M542 Cervicalgia: Secondary | ICD-10-CM | POA: Diagnosis not present

## 2015-03-14 DIAGNOSIS — M545 Low back pain: Secondary | ICD-10-CM | POA: Diagnosis not present

## 2015-03-14 DIAGNOSIS — M542 Cervicalgia: Secondary | ICD-10-CM | POA: Diagnosis not present

## 2015-03-18 DIAGNOSIS — M545 Low back pain: Secondary | ICD-10-CM | POA: Diagnosis not present

## 2015-03-19 DIAGNOSIS — M545 Low back pain: Secondary | ICD-10-CM | POA: Diagnosis not present

## 2015-03-19 DIAGNOSIS — M542 Cervicalgia: Secondary | ICD-10-CM | POA: Diagnosis not present

## 2015-03-21 DIAGNOSIS — M542 Cervicalgia: Secondary | ICD-10-CM | POA: Diagnosis not present

## 2015-03-21 DIAGNOSIS — M545 Low back pain: Secondary | ICD-10-CM | POA: Diagnosis not present

## 2015-04-03 DIAGNOSIS — H40013 Open angle with borderline findings, low risk, bilateral: Secondary | ICD-10-CM | POA: Diagnosis not present

## 2015-04-09 DIAGNOSIS — G35 Multiple sclerosis: Secondary | ICD-10-CM | POA: Diagnosis not present

## 2015-04-09 DIAGNOSIS — D72819 Decreased white blood cell count, unspecified: Secondary | ICD-10-CM | POA: Diagnosis not present

## 2015-05-07 DIAGNOSIS — R05 Cough: Secondary | ICD-10-CM | POA: Diagnosis not present

## 2015-05-07 DIAGNOSIS — R509 Fever, unspecified: Secondary | ICD-10-CM | POA: Diagnosis not present

## 2015-05-07 DIAGNOSIS — J069 Acute upper respiratory infection, unspecified: Secondary | ICD-10-CM | POA: Diagnosis not present

## 2015-05-30 DIAGNOSIS — M5137 Other intervertebral disc degeneration, lumbosacral region: Secondary | ICD-10-CM | POA: Diagnosis not present

## 2015-05-30 DIAGNOSIS — M810 Age-related osteoporosis without current pathological fracture: Secondary | ICD-10-CM | POA: Diagnosis not present

## 2015-05-30 DIAGNOSIS — M542 Cervicalgia: Secondary | ICD-10-CM | POA: Diagnosis not present

## 2015-05-30 DIAGNOSIS — M19241 Secondary osteoarthritis, right hand: Secondary | ICD-10-CM | POA: Diagnosis not present

## 2015-06-30 DIAGNOSIS — J040 Acute laryngitis: Secondary | ICD-10-CM | POA: Diagnosis not present

## 2015-06-30 DIAGNOSIS — J189 Pneumonia, unspecified organism: Secondary | ICD-10-CM | POA: Diagnosis not present

## 2015-06-30 DIAGNOSIS — R0602 Shortness of breath: Secondary | ICD-10-CM | POA: Diagnosis not present

## 2015-07-15 ENCOUNTER — Ambulatory Visit: Payer: Self-pay | Admitting: Podiatry

## 2015-07-16 DIAGNOSIS — M79674 Pain in right toe(s): Secondary | ICD-10-CM | POA: Diagnosis not present

## 2015-07-24 DIAGNOSIS — G35 Multiple sclerosis: Secondary | ICD-10-CM | POA: Diagnosis not present

## 2015-07-24 DIAGNOSIS — R0600 Dyspnea, unspecified: Secondary | ICD-10-CM | POA: Diagnosis not present

## 2015-07-24 DIAGNOSIS — R0602 Shortness of breath: Secondary | ICD-10-CM | POA: Diagnosis not present

## 2015-07-25 DIAGNOSIS — D72829 Elevated white blood cell count, unspecified: Secondary | ICD-10-CM | POA: Diagnosis not present

## 2015-07-25 DIAGNOSIS — E876 Hypokalemia: Secondary | ICD-10-CM | POA: Diagnosis not present

## 2015-07-25 DIAGNOSIS — R0600 Dyspnea, unspecified: Secondary | ICD-10-CM | POA: Diagnosis not present

## 2015-07-25 DIAGNOSIS — R0602 Shortness of breath: Secondary | ICD-10-CM | POA: Diagnosis not present

## 2015-07-25 DIAGNOSIS — I10 Essential (primary) hypertension: Secondary | ICD-10-CM | POA: Diagnosis not present

## 2015-07-26 DIAGNOSIS — G35 Multiple sclerosis: Secondary | ICD-10-CM

## 2015-07-26 DIAGNOSIS — R0600 Dyspnea, unspecified: Secondary | ICD-10-CM | POA: Diagnosis not present

## 2015-07-27 DIAGNOSIS — R739 Hyperglycemia, unspecified: Secondary | ICD-10-CM | POA: Diagnosis not present

## 2015-07-27 DIAGNOSIS — Z885 Allergy status to narcotic agent status: Secondary | ICD-10-CM | POA: Diagnosis not present

## 2015-07-27 DIAGNOSIS — M1990 Unspecified osteoarthritis, unspecified site: Secondary | ICD-10-CM | POA: Diagnosis present

## 2015-07-27 DIAGNOSIS — T380X5A Adverse effect of glucocorticoids and synthetic analogues, initial encounter: Secondary | ICD-10-CM | POA: Diagnosis present

## 2015-07-27 DIAGNOSIS — J810 Acute pulmonary edema: Secondary | ICD-10-CM | POA: Diagnosis present

## 2015-07-27 DIAGNOSIS — I10 Essential (primary) hypertension: Secondary | ICD-10-CM | POA: Diagnosis present

## 2015-07-27 DIAGNOSIS — G35 Multiple sclerosis: Secondary | ICD-10-CM | POA: Diagnosis not present

## 2015-07-27 DIAGNOSIS — K219 Gastro-esophageal reflux disease without esophagitis: Secondary | ICD-10-CM | POA: Diagnosis not present

## 2015-07-27 DIAGNOSIS — R0600 Dyspnea, unspecified: Secondary | ICD-10-CM | POA: Diagnosis not present

## 2015-07-27 DIAGNOSIS — J811 Chronic pulmonary edema: Secondary | ICD-10-CM | POA: Diagnosis not present

## 2015-07-27 DIAGNOSIS — Z7982 Long term (current) use of aspirin: Secondary | ICD-10-CM | POA: Diagnosis not present

## 2015-07-27 DIAGNOSIS — Z79899 Other long term (current) drug therapy: Secondary | ICD-10-CM | POA: Diagnosis not present

## 2015-07-27 DIAGNOSIS — E876 Hypokalemia: Secondary | ICD-10-CM | POA: Diagnosis present

## 2015-08-01 DIAGNOSIS — G35 Multiple sclerosis: Secondary | ICD-10-CM | POA: Diagnosis not present

## 2015-08-01 DIAGNOSIS — D72819 Decreased white blood cell count, unspecified: Secondary | ICD-10-CM | POA: Diagnosis not present

## 2015-08-13 DIAGNOSIS — E876 Hypokalemia: Secondary | ICD-10-CM | POA: Diagnosis not present

## 2015-08-13 DIAGNOSIS — Z23 Encounter for immunization: Secondary | ICD-10-CM | POA: Diagnosis not present

## 2015-08-13 DIAGNOSIS — G35 Multiple sclerosis: Secondary | ICD-10-CM | POA: Diagnosis not present

## 2015-08-13 DIAGNOSIS — Z0000 Encounter for general adult medical examination without abnormal findings: Secondary | ICD-10-CM | POA: Diagnosis not present

## 2015-08-13 DIAGNOSIS — R7309 Other abnormal glucose: Secondary | ICD-10-CM | POA: Diagnosis not present

## 2015-08-19 DIAGNOSIS — Z6835 Body mass index (BMI) 35.0-35.9, adult: Secondary | ICD-10-CM | POA: Diagnosis not present

## 2015-08-19 DIAGNOSIS — Z1231 Encounter for screening mammogram for malignant neoplasm of breast: Secondary | ICD-10-CM | POA: Diagnosis not present

## 2015-08-19 DIAGNOSIS — Z01419 Encounter for gynecological examination (general) (routine) without abnormal findings: Secondary | ICD-10-CM | POA: Diagnosis not present

## 2015-08-22 DIAGNOSIS — I10 Essential (primary) hypertension: Secondary | ICD-10-CM | POA: Diagnosis not present

## 2015-08-22 DIAGNOSIS — N3020 Other chronic cystitis without hematuria: Secondary | ICD-10-CM | POA: Diagnosis not present

## 2015-08-22 DIAGNOSIS — E1165 Type 2 diabetes mellitus with hyperglycemia: Secondary | ICD-10-CM | POA: Diagnosis not present

## 2015-08-22 DIAGNOSIS — G35 Multiple sclerosis: Secondary | ICD-10-CM | POA: Diagnosis not present

## 2015-08-22 DIAGNOSIS — Z7982 Long term (current) use of aspirin: Secondary | ICD-10-CM | POA: Diagnosis not present

## 2015-08-23 DIAGNOSIS — I10 Essential (primary) hypertension: Secondary | ICD-10-CM | POA: Diagnosis not present

## 2015-08-23 DIAGNOSIS — Z7982 Long term (current) use of aspirin: Secondary | ICD-10-CM | POA: Diagnosis not present

## 2015-08-23 DIAGNOSIS — E1165 Type 2 diabetes mellitus with hyperglycemia: Secondary | ICD-10-CM | POA: Diagnosis not present

## 2015-08-23 DIAGNOSIS — N3020 Other chronic cystitis without hematuria: Secondary | ICD-10-CM | POA: Diagnosis not present

## 2015-08-23 DIAGNOSIS — G35 Multiple sclerosis: Secondary | ICD-10-CM | POA: Diagnosis not present

## 2015-08-27 DIAGNOSIS — I10 Essential (primary) hypertension: Secondary | ICD-10-CM | POA: Diagnosis not present

## 2015-08-27 DIAGNOSIS — Z7982 Long term (current) use of aspirin: Secondary | ICD-10-CM | POA: Diagnosis not present

## 2015-08-27 DIAGNOSIS — G35 Multiple sclerosis: Secondary | ICD-10-CM | POA: Diagnosis not present

## 2015-08-27 DIAGNOSIS — E1165 Type 2 diabetes mellitus with hyperglycemia: Secondary | ICD-10-CM | POA: Diagnosis not present

## 2015-08-27 DIAGNOSIS — N3020 Other chronic cystitis without hematuria: Secondary | ICD-10-CM | POA: Diagnosis not present

## 2015-08-28 DIAGNOSIS — H40013 Open angle with borderline findings, low risk, bilateral: Secondary | ICD-10-CM | POA: Diagnosis not present

## 2015-09-06 DIAGNOSIS — N3020 Other chronic cystitis without hematuria: Secondary | ICD-10-CM | POA: Diagnosis not present

## 2015-09-06 DIAGNOSIS — Z7982 Long term (current) use of aspirin: Secondary | ICD-10-CM | POA: Diagnosis not present

## 2015-09-06 DIAGNOSIS — G35 Multiple sclerosis: Secondary | ICD-10-CM | POA: Diagnosis not present

## 2015-09-06 DIAGNOSIS — E1165 Type 2 diabetes mellitus with hyperglycemia: Secondary | ICD-10-CM | POA: Diagnosis not present

## 2015-09-06 DIAGNOSIS — I10 Essential (primary) hypertension: Secondary | ICD-10-CM | POA: Diagnosis not present

## 2015-09-10 DIAGNOSIS — E1165 Type 2 diabetes mellitus with hyperglycemia: Secondary | ICD-10-CM | POA: Diagnosis not present

## 2015-09-10 DIAGNOSIS — G35 Multiple sclerosis: Secondary | ICD-10-CM | POA: Diagnosis not present

## 2015-09-10 DIAGNOSIS — I10 Essential (primary) hypertension: Secondary | ICD-10-CM | POA: Diagnosis not present

## 2015-09-10 DIAGNOSIS — N3020 Other chronic cystitis without hematuria: Secondary | ICD-10-CM | POA: Diagnosis not present

## 2015-09-10 DIAGNOSIS — Z7982 Long term (current) use of aspirin: Secondary | ICD-10-CM | POA: Diagnosis not present

## 2015-09-13 DIAGNOSIS — Z7982 Long term (current) use of aspirin: Secondary | ICD-10-CM | POA: Diagnosis not present

## 2015-09-13 DIAGNOSIS — E1165 Type 2 diabetes mellitus with hyperglycemia: Secondary | ICD-10-CM | POA: Diagnosis not present

## 2015-09-13 DIAGNOSIS — G35 Multiple sclerosis: Secondary | ICD-10-CM | POA: Diagnosis not present

## 2015-09-13 DIAGNOSIS — I10 Essential (primary) hypertension: Secondary | ICD-10-CM | POA: Diagnosis not present

## 2015-09-13 DIAGNOSIS — N3020 Other chronic cystitis without hematuria: Secondary | ICD-10-CM | POA: Diagnosis not present

## 2015-09-17 DIAGNOSIS — G35 Multiple sclerosis: Secondary | ICD-10-CM | POA: Diagnosis not present

## 2015-09-17 DIAGNOSIS — Z7982 Long term (current) use of aspirin: Secondary | ICD-10-CM | POA: Diagnosis not present

## 2015-09-17 DIAGNOSIS — I10 Essential (primary) hypertension: Secondary | ICD-10-CM | POA: Diagnosis not present

## 2015-09-17 DIAGNOSIS — N3020 Other chronic cystitis without hematuria: Secondary | ICD-10-CM | POA: Diagnosis not present

## 2015-09-17 DIAGNOSIS — E1165 Type 2 diabetes mellitus with hyperglycemia: Secondary | ICD-10-CM | POA: Diagnosis not present

## 2015-10-02 DIAGNOSIS — H40013 Open angle with borderline findings, low risk, bilateral: Secondary | ICD-10-CM | POA: Diagnosis not present

## 2015-10-03 DIAGNOSIS — G35 Multiple sclerosis: Secondary | ICD-10-CM | POA: Diagnosis not present

## 2015-10-03 DIAGNOSIS — E1165 Type 2 diabetes mellitus with hyperglycemia: Secondary | ICD-10-CM | POA: Diagnosis not present

## 2015-10-03 DIAGNOSIS — Z7982 Long term (current) use of aspirin: Secondary | ICD-10-CM | POA: Diagnosis not present

## 2015-10-03 DIAGNOSIS — N3020 Other chronic cystitis without hematuria: Secondary | ICD-10-CM | POA: Diagnosis not present

## 2015-10-03 DIAGNOSIS — I10 Essential (primary) hypertension: Secondary | ICD-10-CM | POA: Diagnosis not present

## 2015-10-10 DIAGNOSIS — N3020 Other chronic cystitis without hematuria: Secondary | ICD-10-CM | POA: Diagnosis not present

## 2015-10-10 DIAGNOSIS — G35 Multiple sclerosis: Secondary | ICD-10-CM | POA: Diagnosis not present

## 2015-10-10 DIAGNOSIS — Z7982 Long term (current) use of aspirin: Secondary | ICD-10-CM | POA: Diagnosis not present

## 2015-10-10 DIAGNOSIS — I10 Essential (primary) hypertension: Secondary | ICD-10-CM | POA: Diagnosis not present

## 2015-10-10 DIAGNOSIS — E1165 Type 2 diabetes mellitus with hyperglycemia: Secondary | ICD-10-CM | POA: Diagnosis not present

## 2015-10-17 DIAGNOSIS — E1165 Type 2 diabetes mellitus with hyperglycemia: Secondary | ICD-10-CM | POA: Diagnosis not present

## 2015-10-17 DIAGNOSIS — G35 Multiple sclerosis: Secondary | ICD-10-CM | POA: Diagnosis not present

## 2015-10-17 DIAGNOSIS — I10 Essential (primary) hypertension: Secondary | ICD-10-CM | POA: Diagnosis not present

## 2015-10-17 DIAGNOSIS — Z7982 Long term (current) use of aspirin: Secondary | ICD-10-CM | POA: Diagnosis not present

## 2015-10-17 DIAGNOSIS — N3020 Other chronic cystitis without hematuria: Secondary | ICD-10-CM | POA: Diagnosis not present

## 2015-10-23 DIAGNOSIS — H25043 Posterior subcapsular polar age-related cataract, bilateral: Secondary | ICD-10-CM | POA: Diagnosis not present

## 2015-10-23 DIAGNOSIS — G35 Multiple sclerosis: Secondary | ICD-10-CM | POA: Diagnosis not present

## 2015-10-23 DIAGNOSIS — H2513 Age-related nuclear cataract, bilateral: Secondary | ICD-10-CM | POA: Diagnosis not present

## 2015-10-23 DIAGNOSIS — H40013 Open angle with borderline findings, low risk, bilateral: Secondary | ICD-10-CM | POA: Diagnosis not present

## 2015-11-05 DIAGNOSIS — D72819 Decreased white blood cell count, unspecified: Secondary | ICD-10-CM | POA: Diagnosis not present

## 2015-11-05 DIAGNOSIS — G35 Multiple sclerosis: Secondary | ICD-10-CM | POA: Diagnosis not present

## 2015-11-18 DIAGNOSIS — T380X5A Adverse effect of glucocorticoids and synthetic analogues, initial encounter: Secondary | ICD-10-CM | POA: Diagnosis not present

## 2015-11-18 DIAGNOSIS — E099 Drug or chemical induced diabetes mellitus without complications: Secondary | ICD-10-CM | POA: Diagnosis not present

## 2015-11-18 DIAGNOSIS — Z1389 Encounter for screening for other disorder: Secondary | ICD-10-CM | POA: Diagnosis not present

## 2015-11-18 DIAGNOSIS — Z23 Encounter for immunization: Secondary | ICD-10-CM | POA: Diagnosis not present

## 2015-11-18 DIAGNOSIS — Z9181 History of falling: Secondary | ICD-10-CM | POA: Diagnosis not present

## 2015-11-19 ENCOUNTER — Ambulatory Visit: Payer: Medicare Other | Admitting: Rheumatology

## 2015-11-20 DIAGNOSIS — H40013 Open angle with borderline findings, low risk, bilateral: Secondary | ICD-10-CM | POA: Diagnosis not present

## 2015-11-20 DIAGNOSIS — H2513 Age-related nuclear cataract, bilateral: Secondary | ICD-10-CM | POA: Diagnosis not present

## 2015-11-20 DIAGNOSIS — G35 Multiple sclerosis: Secondary | ICD-10-CM | POA: Diagnosis not present

## 2015-11-20 DIAGNOSIS — H25043 Posterior subcapsular polar age-related cataract, bilateral: Secondary | ICD-10-CM | POA: Diagnosis not present

## 2015-12-16 DIAGNOSIS — G35 Multiple sclerosis: Secondary | ICD-10-CM | POA: Diagnosis not present

## 2015-12-16 DIAGNOSIS — H2512 Age-related nuclear cataract, left eye: Secondary | ICD-10-CM | POA: Diagnosis not present

## 2015-12-16 DIAGNOSIS — E785 Hyperlipidemia, unspecified: Secondary | ICD-10-CM | POA: Diagnosis not present

## 2015-12-16 DIAGNOSIS — Z8614 Personal history of Methicillin resistant Staphylococcus aureus infection: Secondary | ICD-10-CM | POA: Diagnosis not present

## 2015-12-16 DIAGNOSIS — I10 Essential (primary) hypertension: Secondary | ICD-10-CM | POA: Diagnosis not present

## 2015-12-16 DIAGNOSIS — K219 Gastro-esophageal reflux disease without esophagitis: Secondary | ICD-10-CM | POA: Diagnosis not present

## 2015-12-16 DIAGNOSIS — M1990 Unspecified osteoarthritis, unspecified site: Secondary | ICD-10-CM | POA: Diagnosis not present

## 2016-02-26 DIAGNOSIS — M25532 Pain in left wrist: Secondary | ICD-10-CM | POA: Diagnosis not present

## 2016-03-02 DIAGNOSIS — H2511 Age-related nuclear cataract, right eye: Secondary | ICD-10-CM | POA: Diagnosis not present

## 2016-03-02 DIAGNOSIS — E7800 Pure hypercholesterolemia, unspecified: Secondary | ICD-10-CM | POA: Diagnosis not present

## 2016-03-02 DIAGNOSIS — I10 Essential (primary) hypertension: Secondary | ICD-10-CM | POA: Diagnosis not present

## 2016-03-02 DIAGNOSIS — Z7982 Long term (current) use of aspirin: Secondary | ICD-10-CM | POA: Diagnosis not present

## 2016-03-02 DIAGNOSIS — K219 Gastro-esophageal reflux disease without esophagitis: Secondary | ICD-10-CM | POA: Diagnosis not present

## 2016-03-02 DIAGNOSIS — Z79899 Other long term (current) drug therapy: Secondary | ICD-10-CM | POA: Diagnosis not present

## 2016-03-02 DIAGNOSIS — G35 Multiple sclerosis: Secondary | ICD-10-CM | POA: Diagnosis not present

## 2016-05-18 DIAGNOSIS — H40013 Open angle with borderline findings, low risk, bilateral: Secondary | ICD-10-CM | POA: Diagnosis not present

## 2016-05-18 DIAGNOSIS — H26492 Other secondary cataract, left eye: Secondary | ICD-10-CM | POA: Diagnosis not present

## 2016-05-21 DIAGNOSIS — Z86010 Personal history of colonic polyps: Secondary | ICD-10-CM | POA: Diagnosis not present

## 2016-07-16 DIAGNOSIS — G35 Multiple sclerosis: Secondary | ICD-10-CM | POA: Diagnosis not present

## 2016-07-28 DIAGNOSIS — N6459 Other signs and symptoms in breast: Secondary | ICD-10-CM | POA: Diagnosis not present

## 2016-07-29 ENCOUNTER — Other Ambulatory Visit: Payer: Self-pay | Admitting: Obstetrics and Gynecology

## 2016-07-29 DIAGNOSIS — N6453 Retraction of nipple: Secondary | ICD-10-CM

## 2016-08-03 ENCOUNTER — Ambulatory Visit
Admission: RE | Admit: 2016-08-03 | Discharge: 2016-08-03 | Disposition: A | Discharge status: Home / Self Care | Payer: Medicare Other | Source: Ambulatory Visit | Attending: Obstetrics and Gynecology | Admitting: Obstetrics and Gynecology

## 2016-08-03 ENCOUNTER — Other Ambulatory Visit: Payer: Self-pay | Admitting: Obstetrics and Gynecology

## 2016-08-03 DIAGNOSIS — N6453 Retraction of nipple: Secondary | ICD-10-CM

## 2016-08-03 DIAGNOSIS — R928 Other abnormal and inconclusive findings on diagnostic imaging of breast: Secondary | ICD-10-CM | POA: Diagnosis not present

## 2016-08-03 DIAGNOSIS — N6489 Other specified disorders of breast: Secondary | ICD-10-CM | POA: Diagnosis not present

## 2016-08-03 DIAGNOSIS — N6320 Unspecified lump in the left breast, unspecified quadrant: Secondary | ICD-10-CM

## 2016-08-06 ENCOUNTER — Other Ambulatory Visit: Payer: Self-pay | Admitting: Obstetrics and Gynecology

## 2016-08-06 ENCOUNTER — Ambulatory Visit
Admission: RE | Admit: 2016-08-06 | Discharge: 2016-08-06 | Disposition: A | Discharge status: Home / Self Care | Payer: Medicare Other | Source: Ambulatory Visit | Attending: Obstetrics and Gynecology | Admitting: Obstetrics and Gynecology

## 2016-08-06 DIAGNOSIS — N6320 Unspecified lump in the left breast, unspecified quadrant: Secondary | ICD-10-CM

## 2016-08-06 DIAGNOSIS — N6012 Diffuse cystic mastopathy of left breast: Secondary | ICD-10-CM | POA: Diagnosis not present

## 2016-08-06 DIAGNOSIS — R928 Other abnormal and inconclusive findings on diagnostic imaging of breast: Secondary | ICD-10-CM

## 2016-08-06 DIAGNOSIS — N6321 Unspecified lump in the left breast, upper outer quadrant: Secondary | ICD-10-CM | POA: Diagnosis not present

## 2016-09-01 DIAGNOSIS — H1032 Unspecified acute conjunctivitis, left eye: Secondary | ICD-10-CM | POA: Diagnosis not present

## 2016-09-01 DIAGNOSIS — R112 Nausea with vomiting, unspecified: Secondary | ICD-10-CM | POA: Diagnosis not present

## 2016-09-02 DIAGNOSIS — Z13820 Encounter for screening for osteoporosis: Secondary | ICD-10-CM | POA: Diagnosis not present

## 2016-09-02 DIAGNOSIS — M8588 Other specified disorders of bone density and structure, other site: Secondary | ICD-10-CM | POA: Diagnosis not present

## 2016-09-02 DIAGNOSIS — Z01419 Encounter for gynecological examination (general) (routine) without abnormal findings: Secondary | ICD-10-CM | POA: Diagnosis not present

## 2016-09-02 DIAGNOSIS — Z6831 Body mass index (BMI) 31.0-31.9, adult: Secondary | ICD-10-CM | POA: Diagnosis not present

## 2016-09-02 DIAGNOSIS — N958 Other specified menopausal and perimenopausal disorders: Secondary | ICD-10-CM | POA: Diagnosis not present

## 2016-09-17 DIAGNOSIS — I10 Essential (primary) hypertension: Secondary | ICD-10-CM | POA: Diagnosis not present

## 2016-09-17 DIAGNOSIS — E785 Hyperlipidemia, unspecified: Secondary | ICD-10-CM | POA: Diagnosis not present

## 2016-09-17 DIAGNOSIS — G35 Multiple sclerosis: Secondary | ICD-10-CM | POA: Diagnosis not present

## 2016-09-17 DIAGNOSIS — E099 Drug or chemical induced diabetes mellitus without complications: Secondary | ICD-10-CM | POA: Diagnosis not present

## 2016-09-17 DIAGNOSIS — E7800 Pure hypercholesterolemia, unspecified: Secondary | ICD-10-CM | POA: Diagnosis not present

## 2016-09-17 DIAGNOSIS — Z79899 Other long term (current) drug therapy: Secondary | ICD-10-CM | POA: Diagnosis not present

## 2016-09-17 DIAGNOSIS — Z0000 Encounter for general adult medical examination without abnormal findings: Secondary | ICD-10-CM | POA: Diagnosis not present

## 2016-10-21 DIAGNOSIS — D72819 Decreased white blood cell count, unspecified: Secondary | ICD-10-CM | POA: Diagnosis not present

## 2016-10-21 DIAGNOSIS — Z23 Encounter for immunization: Secondary | ICD-10-CM | POA: Diagnosis not present

## 2016-10-21 DIAGNOSIS — G35 Multiple sclerosis: Secondary | ICD-10-CM | POA: Diagnosis not present

## 2016-10-31 DIAGNOSIS — N390 Urinary tract infection, site not specified: Secondary | ICD-10-CM | POA: Diagnosis not present

## 2016-10-31 DIAGNOSIS — R531 Weakness: Secondary | ICD-10-CM | POA: Diagnosis not present

## 2016-10-31 DIAGNOSIS — R079 Chest pain, unspecified: Secondary | ICD-10-CM | POA: Diagnosis not present

## 2016-11-09 DIAGNOSIS — M25512 Pain in left shoulder: Secondary | ICD-10-CM | POA: Diagnosis not present

## 2016-11-09 DIAGNOSIS — M353 Polymyalgia rheumatica: Secondary | ICD-10-CM | POA: Diagnosis not present

## 2016-11-10 DIAGNOSIS — Z6830 Body mass index (BMI) 30.0-30.9, adult: Secondary | ICD-10-CM | POA: Diagnosis not present

## 2016-11-10 DIAGNOSIS — R21 Rash and other nonspecific skin eruption: Secondary | ICD-10-CM | POA: Diagnosis not present

## 2016-11-10 DIAGNOSIS — M353 Polymyalgia rheumatica: Secondary | ICD-10-CM | POA: Diagnosis not present

## 2016-11-10 DIAGNOSIS — Z1339 Encounter for screening examination for other mental health and behavioral disorders: Secondary | ICD-10-CM | POA: Diagnosis not present

## 2016-11-19 DIAGNOSIS — Z6831 Body mass index (BMI) 31.0-31.9, adult: Secondary | ICD-10-CM | POA: Diagnosis not present

## 2016-11-19 DIAGNOSIS — R51 Headache: Secondary | ICD-10-CM | POA: Diagnosis not present

## 2016-11-20 DIAGNOSIS — M26621 Arthralgia of right temporomandibular joint: Secondary | ICD-10-CM | POA: Diagnosis not present

## 2016-11-24 DIAGNOSIS — Z6831 Body mass index (BMI) 31.0-31.9, adult: Secondary | ICD-10-CM | POA: Diagnosis not present

## 2016-11-24 DIAGNOSIS — M26621 Arthralgia of right temporomandibular joint: Secondary | ICD-10-CM | POA: Diagnosis not present

## 2016-11-24 DIAGNOSIS — M25512 Pain in left shoulder: Secondary | ICD-10-CM | POA: Diagnosis not present

## 2016-12-02 DIAGNOSIS — M25512 Pain in left shoulder: Secondary | ICD-10-CM | POA: Diagnosis not present

## 2017-01-13 DIAGNOSIS — G35 Multiple sclerosis: Secondary | ICD-10-CM | POA: Diagnosis not present

## 2017-01-13 DIAGNOSIS — R509 Fever, unspecified: Secondary | ICD-10-CM | POA: Diagnosis not present

## 2017-01-13 DIAGNOSIS — N3000 Acute cystitis without hematuria: Secondary | ICD-10-CM | POA: Diagnosis not present

## 2017-01-14 DIAGNOSIS — G35 Multiple sclerosis: Secondary | ICD-10-CM | POA: Diagnosis not present

## 2017-01-14 NOTE — Progress Notes (Signed)
Office Visit Note  Patient: Carol Green             Date of Birth: 11-29-1950           MRN: [id number]             PCP: Mateo Flow, MD Referring: Mateo Flow, MD Visit Date: 01/28/2017 Occupation: '@GUAROCC'$ @    Subjective:  No chief complaint on file.   History of Present Illness: Carol Green is a 66 y.o. female returns today after her last visit in October 2017. According to patient in October 2018 while she was for chewing a ball game she developed a rash on her left lower extremity and some left arm pain. She states after that she started experiencing pain in her right eye. 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 She was not given any steroids. She states the rash but gradually resolve by itself. As her eye pain persist she was seen by PCP and then was referred to an ENT physician. Who felt that her symptoms were related to TMJ. She did not go to the dentist and the symptoms gradually improved. Her eye symptoms resolved as well. She states all the symptoms are gone now except for her left shoulder which continues to hurt. She has nocturnal pain when she sleeps on her left shoulder. She difficulty lifting her left arm only. Which is improved. She has not noticed any increased weakness in her lower extremities. She does have some underlying weakness due to multiple sclerosis. Patient recalls having a sedimentation rate in October 2018 according to her the sedimentation rate was normal.  Activities of Daily Living:  Patient reports morning stiffness for 0 minute.   Patient Reports nocturnal pain.  Difficulty dressing/grooming: Denies Difficulty climbing stairs: Reports Difficulty getting out of chair: Reports Difficulty using hands for taps, buttons, cutlery, and/or writing: Reports   Review of Systems  Constitutional: Positive for fatigue and weakness. Negative for night sweats, weight gain and weight  loss.  HENT: Positive for mouth dryness. Negative for mouth sores, trouble swallowing, trouble swallowing and nose dryness.   Eyes: Negative.  Negative for pain, redness, visual disturbance and dryness.  Respiratory: Negative.  Negative for cough, shortness of breath and difficulty breathing.   Cardiovascular: Positive for hypertension. Negative for chest pain, palpitations, irregular heartbeat and swelling in legs/feet.  Gastrointestinal: Negative.  Negative for blood in stool, constipation and diarrhea.  Endocrine: Negative for increased urination.  Genitourinary: Negative for vaginal dryness.  Musculoskeletal: Positive for arthralgias and joint pain. Negative for joint swelling, myalgias, muscle weakness, morning stiffness, muscle tenderness and myalgias.  Skin: Negative for color change, rash, hair loss, skin tightness, ulcers and sensitivity to sunlight.  Allergic/Immunologic: Negative for susceptible to infections.  Neurological: Negative for dizziness, numbness, headaches, memory loss and night sweats.  Hematological: Negative for swollen glands.  Psychiatric/Behavioral: Negative.  Negative for depressed mood and sleep disturbance. The patient is not nervous/anxious.     PMFS History:  Patient Active Problem List   Diagnosis Date Noted  . DDD (degenerative disc disease), cervical 01/28/2017  . DDD (degenerative disc disease), lumbar 01/28/2017  . Primary osteoarthritis of both hands 01/28/2017  . Multiple sclerosis ([place]) 01/28/2017  . History of hypertension 01/28/2017  . History of gastroesophageal reflux (GERD) 01/28/2017  . History of diverticulosis 01/28/2017  . History of cholecystectomy 01/28/2017  . History of appendectomy 01/28/2017  .  Chronic cystitis 01/28/2017  . Piriformis syndrome of right side 01/28/2017  . Olecranon bursitis of right elbow 01/28/2017  . Varicose veins of lower extremities with other complications 74/[id number]/[id number]    Past Medical History:  Diagnosis  Date  . Acid reflux   . Alopecia   . Chronic cystitis   . Diverticulosis   . Hypertension   . Multiple sclerosis ([place])   . Varicose veins     Family History  Problem Relation Age of Onset  . Cancer Sister        breast  . Deep vein thrombosis Brother   . Other Brother        varicose veins  . Diabetes Sister   . Deep vein thrombosis Sister   . Hypertension Sister   . Hyperlipidemia Sister   . Other Sister        varicose veins  . Breast cancer Sister   . Cancer Mother        ovarian  . Other Father        varicose veins  . Deep vein thrombosis Father    Past Surgical History:  Procedure Laterality Date  . ABDOMINAL HYSTERECTOMY  1994  . CHOLECYSTECTOMY  1987  . FRACTURE SURGERY Left 2002  . OTHER SURGICAL HISTORY Left 2004   removal fatty tumor left arm  . SHOULDER SURGERY Bilateral 2003 - right; 2008 - left  . TUBAL LIGATION  1977   Social History   Social History Narrative  . Not on file     Objective: Vital Signs: BP 125/68 (BP Location: Right Arm, Patient Position: Sitting, Cuff Size: Normal)   Pulse 78   Ht '5\' 5"'$  (1.651 m)   Wt 197 lb (89.4 kg)   BMI 32.78 kg/m    Physical Exam  Constitutional: She is oriented to person, place, and time. She appears well-developed and well-nourished.  HENT:  Head: Normocephalic and atraumatic.  Eyes: Conjunctivae and EOM are normal.  Neck: Normal range of motion.  Cardiovascular: Normal rate, regular rhythm, normal heart sounds and intact distal pulses.  Pulmonary/Chest: Effort normal and breath sounds normal.  Abdominal: Soft. Bowel sounds are normal.  Lymphadenopathy:    She has no cervical adenopathy.  Neurological: She is alert and oriented to person, place, and time.  No temporal artery tenderness. She does have some generalized muscle weakness to underlying MS  Skin: Skin is warm and dry. Capillary refill takes less than 2 seconds.  Psychiatric: She has a normal mood and affect. Her behavior is normal.    Nursing note and vitals reviewed.    Musculoskeletal Exam: C-spine and  lumbar spine limited range of motion. She has some discomfort with range of motion of her left shoulder joint although it wasn't full range of motion. She is some tenderness over right elbow without any active bursitis. She has DIP PIP thickening in her hands consistent with osteoarthritis. No synovitis was noted. She good range of motion of her hip joints. She still have some tenderness over right piriformis area and bilateral trochanteric area. She has decrease in strength in all of her muscles due to underlying MS but no increased weakness was noted.  CDAI Exam: No CDAI exam completed.    Investigation: No additional findings.   Imaging: Xr Shoulder Left  Result Date: 01/28/2017 No glenohumeral or a chronic level no joint space narrowing was noted. No chondrocalcinosis was noted. Impression: Normal x-ray of the shoulder joint.   Speciality Comments: No specialty comments available.  Procedures:  Large Joint Inj: L glenohumeral on 01/28/2017 11:25 AM Indications: pain Details: 27 G 1.5 in needle, posterior approach  Arthrogram: No  Medications: 1 mL lidocaine 1 %; 40 mg triamcinolone acetonide 40 MG/ML Aspirate: 0 mL Outcome: tolerated well, no immediate complications Procedure, treatment alternatives, risks and benefits explained, specific risks discussed. Consent was given by the patient. Immediately prior to procedure a time out was called to verify the correct patient, procedure, equipment, support staff and site/side marked as required. Patient was prepped and draped in the usual sterile fashion.     Allergies: Codeine; Latex; and Tape   Assessment / Plan:     Visit Diagnoses: Chronic left shoulder pain -she's been having left shoulder joint pain for the last 2 months which is gradually improved. She still have some nocturnal pain. Per her request I will inject her shoulder with cortisone in  the procedures described above. Plan: XR Shoulder Left  History of recent rash: She has no rash on examination today. She states rash resolved itself within a week without any treatment. I discussed with her that she should follow up with [place] in case she has recurrence of rash.  History of right eye pain: The symptoms resolved itself after a few days. Patient reports her ESR was normal.  DDD (degenerative disc disease), cervical: She has limited range of motion.  DDD (degenerative disc disease), lumbar: She continues to have some discomfort in her lumbar region.  Primary osteoarthritis of both hands: She continues to have pain and stiffness in her hands due to underlying osteoarthritis. Piriformis syndrome of right side  Multiple sclerosis ([place]) -followed up by Dr. Metta Clines  History of hypertension: Her blood pressure is well controlled.  Other medical problems are listed as follows:  History of gastroesophageal reflux (GERD)  History of diverticulosis  Chronic cystitis  History of cholecystectomy  History of appendectomy    Orders: Orders Placed This Encounter  Procedures  . Large Joint Inj  . XR Shoulder Left   No orders of the defined types were placed in this encounter.   Face-to-face time spent with patient was 30 minutes. Greater than 50% of time was spent in counseling and coordination of care.  Follow-Up Instructions: Return if symptoms worsen or fail to improve, for OA , DDD.   Bo Merino, MD  Note - This record has been created using Editor, commissioning.  Chart creation errors have been sought, but may not always  have been located. Such creation errors do not reflect on  the standard of medical care.

## 2017-01-15 DIAGNOSIS — G35 Multiple sclerosis: Secondary | ICD-10-CM | POA: Diagnosis not present

## 2017-01-16 DIAGNOSIS — G35 Multiple sclerosis: Secondary | ICD-10-CM | POA: Diagnosis not present

## 2017-01-17 DIAGNOSIS — G35 Multiple sclerosis: Secondary | ICD-10-CM | POA: Diagnosis not present

## 2017-01-18 DIAGNOSIS — N390 Urinary tract infection, site not specified: Secondary | ICD-10-CM | POA: Diagnosis not present

## 2017-01-18 DIAGNOSIS — G35 Multiple sclerosis: Secondary | ICD-10-CM | POA: Diagnosis not present

## 2017-01-18 DIAGNOSIS — Z6831 Body mass index (BMI) 31.0-31.9, adult: Secondary | ICD-10-CM | POA: Diagnosis not present

## 2017-01-18 DIAGNOSIS — I160 Hypertensive urgency: Secondary | ICD-10-CM | POA: Diagnosis not present

## 2017-01-28 ENCOUNTER — Ambulatory Visit (INDEPENDENT_AMBULATORY_CARE_PROVIDER_SITE_OTHER): Payer: Medicare Other | Admitting: Rheumatology

## 2017-01-28 ENCOUNTER — Encounter: Payer: Self-pay | Admitting: Rheumatology

## 2017-01-28 ENCOUNTER — Ambulatory Visit (INDEPENDENT_AMBULATORY_CARE_PROVIDER_SITE_OTHER): Payer: Medicare Other

## 2017-01-28 VITALS — BP 125/68 | HR 78 | Ht 65.0 in | Wt 197.0 lb

## 2017-01-28 DIAGNOSIS — M25512 Pain in left shoulder: Secondary | ICD-10-CM | POA: Diagnosis not present

## 2017-01-28 DIAGNOSIS — G5701 Lesion of sciatic nerve, right lower limb: Secondary | ICD-10-CM

## 2017-01-28 DIAGNOSIS — Z8719 Personal history of other diseases of the digestive system: Secondary | ICD-10-CM

## 2017-01-28 DIAGNOSIS — Z8679 Personal history of other diseases of the circulatory system: Secondary | ICD-10-CM

## 2017-01-28 DIAGNOSIS — M19041 Primary osteoarthritis, right hand: Secondary | ICD-10-CM | POA: Diagnosis not present

## 2017-01-28 DIAGNOSIS — G8929 Other chronic pain: Secondary | ICD-10-CM | POA: Diagnosis not present

## 2017-01-28 DIAGNOSIS — M5136 Other intervertebral disc degeneration, lumbar region: Secondary | ICD-10-CM | POA: Diagnosis not present

## 2017-01-28 DIAGNOSIS — M7021 Olecranon bursitis, right elbow: Secondary | ICD-10-CM | POA: Insufficient documentation

## 2017-01-28 DIAGNOSIS — Z9049 Acquired absence of other specified parts of digestive tract: Secondary | ICD-10-CM | POA: Insufficient documentation

## 2017-01-28 DIAGNOSIS — M19042 Primary osteoarthritis, left hand: Secondary | ICD-10-CM

## 2017-01-28 DIAGNOSIS — M5030 Other cervical disc degeneration, unspecified cervical region: Secondary | ICD-10-CM | POA: Diagnosis not present

## 2017-01-28 DIAGNOSIS — N3020 Other chronic cystitis without hematuria: Secondary | ICD-10-CM

## 2017-01-28 DIAGNOSIS — G35 Multiple sclerosis: Secondary | ICD-10-CM

## 2017-01-28 MED ORDER — LIDOCAINE HCL 1 % IJ SOLN
1.0000 mL | INTRAMUSCULAR | Status: AC | PRN
  Administered 2017-01-28: 1 mL

## 2017-01-28 MED ORDER — TRIAMCINOLONE ACETONIDE 40 MG/ML IJ SUSP
40.0000 mg | INTRAMUSCULAR | Status: AC | PRN
  Administered 2017-01-28: 40 mg via INTRA_ARTICULAR

## 2017-01-28 NOTE — Patient Instructions (Signed)
Shoulder Exercises Ask your health care provider which exercises are safe for you. Do exercises exactly as told by your health care provider and adjust them as directed. It is normal to feel mild stretching, pulling, tightness, or discomfort as you do these exercises, but you should stop right away if you feel sudden pain or your pain gets worse.Do not begin these exercises until told by your health care provider. RANGE OF MOTION EXERCISES These exercises warm up your muscles and joints and improve the movement and flexibility of your shoulder. These exercises also help to relieve pain, numbness, and tingling. These exercises involve stretching your injured shoulder directly. Exercise A: Pendulum  1. Stand near a wall or a surface that you can hold onto for balance. 2. Bend at the waist and let your left / right arm hang straight down. Use your other arm to support you. Keep your back straight and do not lock your knees. 3. Relax your left / right arm and shoulder muscles, and move your hips and your trunk so your left / right arm swings freely. Your arm should swing because of the motion of your body, not because you are using your arm or shoulder muscles. 4. Keep moving your body so your arm swings in the following directions, as told by your health care provider: ? Side to side. ? Forward and backward. ? In clockwise and counterclockwise circles. 5. Continue each motion for __________ seconds, or for as long as told by your health care provider. 6. Slowly return to the starting position. Repeat __________ times. Complete this exercise __________ times a day. Exercise B:Flexion, Standing  1. Stand and hold a broomstick, a cane, or a similar object. Place your hands a little more than shoulder-width apart on the object. Your left / right hand should be palm-up, and your other hand should be palm-down. 2. Keep your elbow straight and keep your shoulder muscles relaxed. Push the stick down with  your healthy arm to raise your left / right arm in front of your body, and then over your head until you feel a stretch in your shoulder. ? Avoid shrugging your shoulder while you raise your arm. Keep your shoulder blade tucked down toward the middle of your back. 3. Hold for __________ seconds. 4. Slowly return to the starting position. Repeat __________ times. Complete this exercise __________ times a day. Exercise C: Abduction, Standing 1. Stand and hold a broomstick, a cane, or a similar object. Place your hands a little more than shoulder-width apart on the object. Your left / right hand should be palm-up, and your other hand should be palm-down. 2. While keeping your elbow straight and your shoulder muscles relaxed, push the stick across your body toward your left / right side. Raise your left / right arm to the side of your body and then over your head until you feel a stretch in your shoulder. ? Do not raise your arm above shoulder height, unless your health care provider tells you to do that. ? Avoid shrugging your shoulder while you raise your arm. Keep your shoulder blade tucked down toward the middle of your back. 3. Hold for __________ seconds. 4. Slowly return to the starting position. Repeat __________ times. Complete this exercise __________ times a day. Exercise D:Internal Rotation  1. Place your left / right hand behind your back, palm-up. 2. Use your other hand to dangle an exercise band, a towel, or a similar object over your shoulder. Grasp the band with   your left / right hand so you are holding onto both ends. 3. Gently pull up on the band until you feel a stretch in the front of your left / right shoulder. ? Avoid shrugging your shoulder while you raise your arm. Keep your shoulder blade tucked down toward the middle of your back. 4. Hold for __________ seconds. 5. Release the stretch by letting go of the band and lowering your hands. Repeat __________ times. Complete  this exercise __________ times a day. STRETCHING EXERCISES These exercises warm up your muscles and joints and improve the movement and flexibility of your shoulder. These exercises also help to relieve pain, numbness, and tingling. These exercises are done using your healthy shoulder to help stretch the muscles of your injured shoulder. Exercise E: Corner Stretch (External Rotation and Abduction)  1. Stand in a doorway with one of your feet slightly in front of the other. This is called a staggered stance. If you cannot reach your forearms to the door frame, stand facing a corner of a room. 2. Choose one of the following positions as told by your health care provider: ? Place your hands and forearms on the door frame above your head. ? Place your hands and forearms on the door frame at the height of your head. ? Place your hands on the door frame at the height of your elbows. 3. Slowly move your weight onto your front foot until you feel a stretch across your chest and in the front of your shoulders. Keep your head and chest upright and keep your abdominal muscles tight. 4. Hold for __________ seconds. 5. To release the stretch, shift your weight to your back foot. Repeat __________ times. Complete this stretch __________ times a day. Exercise F:Extension, Standing 1. Stand and hold a broomstick, a cane, or a similar object behind your back. ? Your hands should be a little wider than shoulder-width apart. ? Your palms should face away from your back. 2. Keeping your elbows straight and keeping your shoulder muscles relaxed, move the stick away from your body until you feel a stretch in your shoulder. ? Avoid shrugging your shoulders while you move the stick. Keep your shoulder blade tucked down toward the middle of your back. 3. Hold for __________ seconds. 4. Slowly return to the starting position. Repeat __________ times. Complete this exercise __________ times a day. STRENGTHENING  EXERCISES These exercises build strength and endurance in your shoulder. Endurance is the ability to use your muscles for a long time, even after they get tired. Exercise G:External Rotation  1. Sit in a stable chair without armrests. 2. Secure an exercise band at elbow height on your left / right side. 3. Place a soft object, such as a folded towel or a small pillow, between your left / right upper arm and your body to move your elbow a few inches away (about 10 cm) from your side. 4. Hold the end of the band so it is tight and there is no slack. 5. Keeping your elbow pressed against the soft object, move your left / right forearm out, away from your abdomen. Keep your body steady so only your forearm moves. 6. Hold for __________ seconds. 7. Slowly return to the starting position. Repeat __________ times. Complete this exercise __________ times a day. Exercise H:Shoulder Abduction  1. Sit in a stable chair without armrests, or stand. 2. Hold a __________ weight in your left / right hand, or hold an exercise band with both hands.   3. Start with your arms straight down and your left / right palm facing in, toward your body. 4. Slowly lift your left / right hand out to your side. Do not lift your hand above shoulder height unless your health care provider tells you that this is safe. ? Keep your arms straight. ? Avoid shrugging your shoulder while you do this movement. Keep your shoulder blade tucked down toward the middle of your back. 5. Hold for __________ seconds. 6. Slowly lower your arm, and return to the starting position. Repeat __________ times. Complete this exercise __________ times a day. Exercise I:Shoulder Extension 1. Sit in a stable chair without armrests, or stand. 2. Secure an exercise band to a stable object in front of you where it is at shoulder height. 3. Hold one end of the exercise band in each hand. Your palms should face each other. 4. Straighten your elbows and  lift your hands up to shoulder height. 5. Step back, away from the secured end of the exercise band, until the band is tight and there is no slack. 6. Squeeze your shoulder blades together as you pull your hands down to the sides of your thighs. Stop when your hands are straight down by your sides. Do not let your hands go behind your body. 7. Hold for __________ seconds. 8. Slowly return to the starting position. Repeat __________ times. Complete this exercise __________ times a day. Exercise J:Standing Shoulder Row 1. Sit in a stable chair without armrests, or stand. 2. Secure an exercise band to a stable object in front of you so it is at waist height. 3. Hold one end of the exercise band in each hand. Your palms should be in a thumbs-up position. 4. Bend each of your elbows to an "L" shape (about 90 degrees) and keep your upper arms at your sides. 5. Step back until the band is tight and there is no slack. 6. Slowly pull your elbows back behind you. 7. Hold for __________ seconds. 8. Slowly return to the starting position. Repeat __________ times. Complete this exercise __________ times a day. Exercise K:Shoulder Press-Ups  1. Sit in a stable chair that has armrests. Sit upright, with your feet flat on the floor. 2. Put your hands on the armrests so your elbows are bent and your fingers are pointing forward. Your hands should be about even with the sides of your body. 3. Push down on the armrests and use your arms to lift yourself off of the chair. Straighten your elbows and lift yourself up as much as you comfortably can. ? Move your shoulder blades down, and avoid letting your shoulders move up toward your ears. ? Keep your feet on the ground. As you get stronger, your feet should support less of your body weight as you lift yourself up. 4. Hold for __________ seconds. 5. Slowly lower yourself back into the chair. Repeat __________ times. Complete this exercise __________ times a  day. Exercise L: Wall Push-Ups  1. Stand so you are facing a stable wall. Your feet should be about one arm-length away from the wall. 2. Lean forward and place your palms on the wall at shoulder height. 3. Keep your feet flat on the floor as you bend your elbows and lean forward toward the wall. 4. Hold for __________ seconds. 5. Straighten your elbows to push yourself back to the starting position. Repeat __________ times. Complete this exercise __________ times a day. This information is not intended to replace advice   given to you by your health care provider. Make sure you discuss any questions you have with your health care provider. Document Released: 12/03/2004 Document Revised: 10/14/2015 Document Reviewed: 09/30/2014 [employer] Interactive Patient Education  2018 [employer]  

## 2017-02-03 DIAGNOSIS — N390 Urinary tract infection, site not specified: Secondary | ICD-10-CM | POA: Diagnosis not present

## 2017-02-03 DIAGNOSIS — Z79899 Other long term (current) drug therapy: Secondary | ICD-10-CM | POA: Diagnosis not present

## 2017-02-03 DIAGNOSIS — E782 Mixed hyperlipidemia: Secondary | ICD-10-CM | POA: Diagnosis not present

## 2017-02-09 DIAGNOSIS — H40013 Open angle with borderline findings, low risk, bilateral: Secondary | ICD-10-CM | POA: Diagnosis not present

## 2017-02-09 DIAGNOSIS — G35 Multiple sclerosis: Secondary | ICD-10-CM | POA: Diagnosis not present

## 2017-03-02 DIAGNOSIS — N3020 Other chronic cystitis without hematuria: Secondary | ICD-10-CM | POA: Diagnosis not present

## 2017-03-02 DIAGNOSIS — Z6831 Body mass index (BMI) 31.0-31.9, adult: Secondary | ICD-10-CM | POA: Diagnosis not present

## 2017-03-02 DIAGNOSIS — J069 Acute upper respiratory infection, unspecified: Secondary | ICD-10-CM | POA: Diagnosis not present

## 2017-05-30 DIAGNOSIS — H6123 Impacted cerumen, bilateral: Secondary | ICD-10-CM | POA: Diagnosis not present

## 2017-06-25 DIAGNOSIS — H9192 Unspecified hearing loss, left ear: Secondary | ICD-10-CM | POA: Diagnosis not present

## 2017-08-19 DIAGNOSIS — T380X5A Adverse effect of glucocorticoids and synthetic analogues, initial encounter: Secondary | ICD-10-CM | POA: Diagnosis not present

## 2017-08-19 DIAGNOSIS — E099 Drug or chemical induced diabetes mellitus without complications: Secondary | ICD-10-CM | POA: Diagnosis not present

## 2017-08-19 DIAGNOSIS — Z79899 Other long term (current) drug therapy: Secondary | ICD-10-CM | POA: Diagnosis not present

## 2017-08-19 DIAGNOSIS — R159 Full incontinence of feces: Secondary | ICD-10-CM | POA: Diagnosis not present

## 2017-08-19 DIAGNOSIS — E782 Mixed hyperlipidemia: Secondary | ICD-10-CM | POA: Diagnosis not present

## 2017-09-08 DIAGNOSIS — G35 Multiple sclerosis: Secondary | ICD-10-CM | POA: Diagnosis not present

## 2017-11-29 DIAGNOSIS — Z23 Encounter for immunization: Secondary | ICD-10-CM | POA: Diagnosis not present

## 2017-12-14 DIAGNOSIS — J309 Allergic rhinitis, unspecified: Secondary | ICD-10-CM | POA: Diagnosis not present

## 2018-01-27 DIAGNOSIS — L03019 Cellulitis of unspecified finger: Secondary | ICD-10-CM | POA: Diagnosis not present

## 2018-02-23 DIAGNOSIS — G35 Multiple sclerosis: Secondary | ICD-10-CM | POA: Diagnosis not present

## 2018-03-09 DIAGNOSIS — Z6833 Body mass index (BMI) 33.0-33.9, adult: Secondary | ICD-10-CM | POA: Diagnosis not present

## 2018-03-09 DIAGNOSIS — Z0000 Encounter for general adult medical examination without abnormal findings: Secondary | ICD-10-CM | POA: Diagnosis not present

## 2018-03-09 DIAGNOSIS — G35 Multiple sclerosis: Secondary | ICD-10-CM | POA: Diagnosis not present

## 2018-05-24 DIAGNOSIS — D72819 Decreased white blood cell count, unspecified: Secondary | ICD-10-CM | POA: Diagnosis not present

## 2018-05-24 DIAGNOSIS — G4700 Insomnia, unspecified: Secondary | ICD-10-CM | POA: Diagnosis not present

## 2018-05-24 DIAGNOSIS — G35 Multiple sclerosis: Secondary | ICD-10-CM | POA: Diagnosis not present

## 2018-09-13 DIAGNOSIS — G35 Multiple sclerosis: Secondary | ICD-10-CM | POA: Diagnosis not present

## 2018-09-13 DIAGNOSIS — Z961 Presence of intraocular lens: Secondary | ICD-10-CM | POA: Diagnosis not present

## 2018-09-13 DIAGNOSIS — Z9889 Other specified postprocedural states: Secondary | ICD-10-CM | POA: Diagnosis not present

## 2018-09-13 DIAGNOSIS — H40013 Open angle with borderline findings, low risk, bilateral: Secondary | ICD-10-CM | POA: Diagnosis not present

## 2018-09-27 DIAGNOSIS — Z6833 Body mass index (BMI) 33.0-33.9, adult: Secondary | ICD-10-CM | POA: Diagnosis not present

## 2018-09-27 DIAGNOSIS — R1310 Dysphagia, unspecified: Secondary | ICD-10-CM | POA: Diagnosis not present

## 2018-09-27 DIAGNOSIS — Z1331 Encounter for screening for depression: Secondary | ICD-10-CM | POA: Diagnosis not present

## 2018-11-18 DIAGNOSIS — R1313 Dysphagia, pharyngeal phase: Secondary | ICD-10-CM | POA: Diagnosis not present

## 2018-11-18 DIAGNOSIS — K219 Gastro-esophageal reflux disease without esophagitis: Secondary | ICD-10-CM | POA: Diagnosis not present

## 2018-11-22 ENCOUNTER — Other Ambulatory Visit (HOSPITAL_COMMUNITY): Payer: Self-pay | Admitting: *Deleted

## 2018-11-22 DIAGNOSIS — R1310 Dysphagia, unspecified: Secondary | ICD-10-CM

## 2018-11-24 ENCOUNTER — Ambulatory Visit (HOSPITAL_COMMUNITY)
Admission: RE | Admit: 2018-11-24 | Discharge: 2018-11-24 | Disposition: A | Discharge status: Home / Self Care | Payer: Medicare Other | Source: Ambulatory Visit | Attending: Gastroenterology | Admitting: Gastroenterology

## 2018-11-24 ENCOUNTER — Other Ambulatory Visit: Payer: Self-pay

## 2018-11-24 DIAGNOSIS — R1310 Dysphagia, unspecified: Secondary | ICD-10-CM | POA: Diagnosis not present

## 2018-11-24 DIAGNOSIS — G35 Multiple sclerosis: Secondary | ICD-10-CM | POA: Diagnosis not present

## 2018-11-24 NOTE — Progress Notes (Signed)
Objective Swallowing Evaluation: Type of Study: MBS-Modified Barium Swallow Study   Patient Details  Name: Carol Green MRN: [id number] Date of Birth: 21-Aug-1950  Today's Date: 11/24/2018 Time: SLP Start Time (ACUTE ONLY): 1130 -SLP Stop Time (ACUTE ONLY): 1155  SLP Time Calculation (min) (ACUTE ONLY): 25 min   Past Medical History:  Past Medical History:  Diagnosis Date  . Acid reflux   . Alopecia   . Chronic cystitis   . Diverticulosis   . Hypertension   . Multiple sclerosis ([place])   . Varicose veins    Past Surgical History:  Past Surgical History:  Procedure Laterality Date  . ABDOMINAL HYSTERECTOMY  1994  . CHOLECYSTECTOMY  1987  . FRACTURE SURGERY Left 2002  . OTHER SURGICAL HISTORY Left 2004   removal fatty tumor left arm  . SHOULDER SURGERY Bilateral 2003 - right; 2008 - left  . TUBAL LIGATION  1977   HPI: Ms Carol Green, 67y/f, present today for an out patient Modified Barium Swallow Evaluation. PMH of Multiple sclerosis, hypertension, acid reflux, diverticulosis, chronic cystitis, alopecia and vericose veins. Her symptoms started approximately 4 weeks ago as food sticking in her throat and on 2 occassions had to be expectorated. Patients current diet is regular consistencies with thin liquids with no difficulties taking medications whole (all sizes) with liquids.    Subjective: pleasant and cooperative    Assessment / Plan / Recommendation  CHL IP CLINICAL IMPRESSIONS 11/24/2018  Clinical Impression Patient found to have normal oropharyngeal swallowing function with primary esophageal dysfunction characterized by backflow of all consistencies to the upper esophagus. Patient reports having heart burn for the last month at bedtime, but has gotten worse and more frequently during the daytime now. She began taking OTC Equalactin tablets before breakfast and dinner one week ago, with no noticible improvement. During the esophageal sweep food was noted sitting  throughout the esophagus. Results provided verbally to patient as well as modifications, many of which the patient reports she is already doing (Eat slowly, alternate liquids and solids, small bites and sips). Recommend patient follow up with Gastroenterology for further management of reflux. No further Speech Therapy is recommended at this time.   SLP Visit Diagnosis Dysphagia, unspecified (R13.10)  Attention and concentration deficit following --  Frontal lobe and executive function deficit following --  Impact on safety and function Mild aspiration risk      No flowsheet data found.   No flowsheet data found.  CHL IP DIET RECOMMENDATION 11/24/2018  SLP Diet Recommendations Regular solids;Thin liquid  Liquid Administration via Cup;Straw  Medication Administration Whole meds with liquid  Compensations Slow rate;Small sips/bites;Follow solids with liquid  Postural Changes Seated upright at 90 degrees      CHL IP OTHER RECOMMENDATIONS 11/24/2018  Recommended Consults Consider GI evaluation  Oral Care Recommendations Oral care BID  Other Recommendations --      CHL IP FOLLOW UP RECOMMENDATIONS 11/24/2018  Follow up Recommendations None      No flowsheet data found.         CHL IP ORAL PHASE 11/24/2018  Oral Phase WFL  Oral - Pudding Teaspoon --  Oral - Pudding Cup --  Oral - Honey Teaspoon --  Oral - Honey Cup --  Oral - Nectar Teaspoon --  Oral - Nectar Cup --  Oral - Nectar Straw --  Oral - Thin Teaspoon --  Oral - Thin Cup --  Oral - Thin Straw --  Oral - Puree --  Oral -  Mech Soft --  Oral - Regular --  Oral - Multi-Consistency --  Oral - Pill --  Oral Phase - Comment --    CHL IP PHARYNGEAL PHASE 11/24/2018  Pharyngeal Phase WFL  Pharyngeal- Pudding Teaspoon --  Pharyngeal --  Pharyngeal- Pudding Cup --  Pharyngeal --  Pharyngeal- Honey Teaspoon --  Pharyngeal --  Pharyngeal- Honey Cup --  Pharyngeal --  Pharyngeal- Nectar Teaspoon --  Pharyngeal --   Pharyngeal- Nectar Cup --  Pharyngeal --  Pharyngeal- Nectar Straw --  Pharyngeal --  Pharyngeal- Thin Teaspoon --  Pharyngeal --  Pharyngeal- Thin Cup --  Pharyngeal --  Pharyngeal- Thin Straw --  Pharyngeal --  Pharyngeal- Puree --  Pharyngeal --  Pharyngeal- Mechanical Soft --  Pharyngeal --  Pharyngeal- Regular --  Pharyngeal --  Pharyngeal- Multi-consistency --  Pharyngeal --  Pharyngeal- Pill --  Pharyngeal --  Pharyngeal Comment --     CHL IP CERVICAL ESOPHAGEAL PHASE 11/24/2018  Cervical Esophageal Phase Impaired  Pudding Teaspoon --  Pudding Cup --  Honey Teaspoon --  Honey Cup --  Nectar Teaspoon --  Nectar Cup --  Nectar Straw --  Thin Teaspoon --  Thin Cup Esophageal backflow into cervical esophagus  Thin Straw --  Puree Esophageal backflow into cervical esophagus  Mechanical Soft Esophageal backflow into cervical esophagus  Regular --  Multi-consistency --  Pill WFL  Cervical Esophageal Comment --     Ty Ty, MA, CCC-SLP 11/24/2018 12:26 PM

## 2018-12-01 DIAGNOSIS — D72819 Decreased white blood cell count, unspecified: Secondary | ICD-10-CM | POA: Diagnosis not present

## 2018-12-01 DIAGNOSIS — G35 Multiple sclerosis: Secondary | ICD-10-CM | POA: Diagnosis not present

## 2018-12-01 DIAGNOSIS — G4700 Insomnia, unspecified: Secondary | ICD-10-CM | POA: Diagnosis not present

## 2018-12-01 DIAGNOSIS — Z23 Encounter for immunization: Secondary | ICD-10-CM | POA: Diagnosis not present

## 2018-12-08 DIAGNOSIS — G35 Multiple sclerosis: Secondary | ICD-10-CM | POA: Diagnosis not present

## 2018-12-21 DIAGNOSIS — J189 Pneumonia, unspecified organism: Secondary | ICD-10-CM | POA: Diagnosis not present

## 2018-12-21 DIAGNOSIS — J3489 Other specified disorders of nose and nasal sinuses: Secondary | ICD-10-CM | POA: Diagnosis not present

## 2018-12-21 DIAGNOSIS — R05 Cough: Secondary | ICD-10-CM | POA: Diagnosis not present

## 2018-12-21 DIAGNOSIS — J0100 Acute maxillary sinusitis, unspecified: Secondary | ICD-10-CM | POA: Diagnosis not present

## 2018-12-22 DIAGNOSIS — H60391 Other infective otitis externa, right ear: Secondary | ICD-10-CM | POA: Diagnosis not present

## 2018-12-22 DIAGNOSIS — Z0183 Encounter for blood typing: Secondary | ICD-10-CM | POA: Diagnosis not present

## 2019-01-05 ENCOUNTER — Other Ambulatory Visit: Payer: Self-pay | Admitting: Gastroenterology

## 2019-01-05 DIAGNOSIS — K219 Gastro-esophageal reflux disease without esophagitis: Secondary | ICD-10-CM | POA: Diagnosis not present

## 2019-01-05 DIAGNOSIS — R1310 Dysphagia, unspecified: Secondary | ICD-10-CM | POA: Diagnosis not present

## 2019-01-05 DIAGNOSIS — R1313 Dysphagia, pharyngeal phase: Secondary | ICD-10-CM | POA: Diagnosis not present

## 2019-01-05 DIAGNOSIS — R1319 Other dysphagia: Secondary | ICD-10-CM

## 2019-01-13 ENCOUNTER — Ambulatory Visit
Admission: RE | Admit: 2019-01-13 | Discharge: 2019-01-13 | Disposition: A | Discharge status: Home / Self Care | Payer: Medicare Other | Source: Ambulatory Visit | Attending: Gastroenterology | Admitting: Gastroenterology

## 2019-01-13 DIAGNOSIS — R1319 Other dysphagia: Secondary | ICD-10-CM

## 2019-01-13 DIAGNOSIS — R1310 Dysphagia, unspecified: Secondary | ICD-10-CM

## 2019-01-13 DIAGNOSIS — K449 Diaphragmatic hernia without obstruction or gangrene: Secondary | ICD-10-CM | POA: Diagnosis not present

## 2019-01-13 DIAGNOSIS — K222 Esophageal obstruction: Secondary | ICD-10-CM | POA: Diagnosis not present

## 2019-01-17 DIAGNOSIS — Z20828 Contact with and (suspected) exposure to other viral communicable diseases: Secondary | ICD-10-CM | POA: Diagnosis not present

## 2019-01-17 DIAGNOSIS — R0981 Nasal congestion: Secondary | ICD-10-CM | POA: Diagnosis not present

## 2019-01-30 DIAGNOSIS — Z20828 Contact with and (suspected) exposure to other viral communicable diseases: Secondary | ICD-10-CM | POA: Diagnosis not present

## 2019-02-22 DIAGNOSIS — K219 Gastro-esophageal reflux disease without esophagitis: Secondary | ICD-10-CM | POA: Diagnosis not present

## 2019-02-22 DIAGNOSIS — R1313 Dysphagia, pharyngeal phase: Secondary | ICD-10-CM | POA: Diagnosis not present

## 2019-02-22 DIAGNOSIS — R1310 Dysphagia, unspecified: Secondary | ICD-10-CM | POA: Diagnosis not present

## 2019-03-16 DIAGNOSIS — I10 Essential (primary) hypertension: Secondary | ICD-10-CM | POA: Diagnosis not present

## 2019-03-16 DIAGNOSIS — G35 Multiple sclerosis: Secondary | ICD-10-CM | POA: Diagnosis not present

## 2019-03-16 DIAGNOSIS — E782 Mixed hyperlipidemia: Secondary | ICD-10-CM | POA: Diagnosis not present

## 2019-03-16 DIAGNOSIS — Z0183 Encounter for blood typing: Secondary | ICD-10-CM | POA: Diagnosis not present

## 2019-04-27 DIAGNOSIS — Z1331 Encounter for screening for depression: Secondary | ICD-10-CM | POA: Diagnosis not present

## 2019-04-27 DIAGNOSIS — Z6833 Body mass index (BMI) 33.0-33.9, adult: Secondary | ICD-10-CM | POA: Diagnosis not present

## 2019-04-27 DIAGNOSIS — L659 Nonscarring hair loss, unspecified: Secondary | ICD-10-CM | POA: Diagnosis not present

## 2019-05-19 DIAGNOSIS — M79662 Pain in left lower leg: Secondary | ICD-10-CM | POA: Diagnosis not present

## 2019-05-19 DIAGNOSIS — M79605 Pain in left leg: Secondary | ICD-10-CM | POA: Diagnosis not present

## 2019-05-19 DIAGNOSIS — M1710 Unilateral primary osteoarthritis, unspecified knee: Secondary | ICD-10-CM | POA: Diagnosis not present

## 2019-05-19 DIAGNOSIS — E669 Obesity, unspecified: Secondary | ICD-10-CM | POA: Diagnosis not present

## 2019-05-19 DIAGNOSIS — Z6834 Body mass index (BMI) 34.0-34.9, adult: Secondary | ICD-10-CM | POA: Diagnosis not present

## 2019-05-23 DIAGNOSIS — Z6834 Body mass index (BMI) 34.0-34.9, adult: Secondary | ICD-10-CM | POA: Diagnosis not present

## 2019-05-23 DIAGNOSIS — B029 Zoster without complications: Secondary | ICD-10-CM | POA: Diagnosis not present

## 2019-06-02 DIAGNOSIS — K219 Gastro-esophageal reflux disease without esophagitis: Secondary | ICD-10-CM | POA: Diagnosis not present

## 2019-06-02 DIAGNOSIS — M25562 Pain in left knee: Secondary | ICD-10-CM | POA: Diagnosis not present

## 2019-06-02 DIAGNOSIS — M1712 Unilateral primary osteoarthritis, left knee: Secondary | ICD-10-CM | POA: Diagnosis not present

## 2019-06-02 DIAGNOSIS — I10 Essential (primary) hypertension: Secondary | ICD-10-CM | POA: Diagnosis not present

## 2019-06-26 DIAGNOSIS — M25562 Pain in left knee: Secondary | ICD-10-CM | POA: Diagnosis not present

## 2019-06-26 DIAGNOSIS — M1712 Unilateral primary osteoarthritis, left knee: Secondary | ICD-10-CM | POA: Diagnosis not present

## 2019-07-03 DIAGNOSIS — I10 Essential (primary) hypertension: Secondary | ICD-10-CM | POA: Diagnosis not present

## 2019-07-03 DIAGNOSIS — E782 Mixed hyperlipidemia: Secondary | ICD-10-CM | POA: Diagnosis not present

## 2019-07-03 DIAGNOSIS — K219 Gastro-esophageal reflux disease without esophagitis: Secondary | ICD-10-CM | POA: Diagnosis not present

## 2019-07-13 DIAGNOSIS — D72819 Decreased white blood cell count, unspecified: Secondary | ICD-10-CM | POA: Diagnosis not present

## 2019-07-13 DIAGNOSIS — G4700 Insomnia, unspecified: Secondary | ICD-10-CM | POA: Diagnosis not present

## 2019-07-13 DIAGNOSIS — G35 Multiple sclerosis: Secondary | ICD-10-CM | POA: Diagnosis not present

## 2019-07-19 ENCOUNTER — Encounter (HOSPITAL_COMMUNITY)
Admission: RE | Admit: 2019-07-19 | Discharge: 2019-07-19 | Disposition: A | Discharge status: Home / Self Care | Payer: Medicare Other | Source: Ambulatory Visit | Attending: Orthopedic Surgery | Admitting: Orthopedic Surgery

## 2019-07-19 ENCOUNTER — Other Ambulatory Visit: Payer: Self-pay

## 2019-07-19 ENCOUNTER — Encounter (HOSPITAL_COMMUNITY): Payer: Self-pay

## 2019-07-19 DIAGNOSIS — Z01818 Encounter for other preprocedural examination: Secondary | ICD-10-CM | POA: Diagnosis not present

## 2019-07-19 HISTORY — DX: Unspecified osteoarthritis, unspecified site: M19.90

## 2019-07-19 LAB — CBC
HCT: 39.1 % (ref 36.0–46.0)
Hemoglobin: 12.1 g/dL (ref 12.0–15.0)
MCH: 30 pg (ref 26.0–34.0)
MCHC: 30.9 g/dL (ref 30.0–36.0)
MCV: 96.8 fL (ref 80.0–100.0)
Platelets: 180 10*3/uL (ref 150–400)
RBC: 4.04 MIL/uL (ref 3.87–5.11)
RDW: 15.3 % (ref 11.5–15.5)
WBC: 5.3 10*3/uL (ref 4.0–10.5)
nRBC: 0 % (ref 0.0–0.2)

## 2019-07-19 LAB — TYPE AND SCREEN
ABO/RH(D): O POS
Antibody Screen: NEGATIVE

## 2019-07-19 LAB — BASIC METABOLIC PANEL
Anion gap: 8 (ref 5–15)
BUN: 22 mg/dL (ref 8–23)
CO2: 27 mmol/L (ref 22–32)
Calcium: 8.9 mg/dL (ref 8.9–10.3)
Chloride: 102 mmol/L (ref 98–111)
Creatinine, Ser: 1.03 mg/dL — ABNORMAL HIGH (ref 0.44–1.00)
GFR calc Af Amer: >60 mL/min (ref >60)
GFR calc non Af Amer: 55 mL/min — ABNORMAL LOW (ref >60)
Glucose, Bld: 149 mg/dL — ABNORMAL HIGH (ref 70–99)
Potassium: 3.8 mmol/L (ref 3.5–5.1)
Sodium: 137 mmol/L (ref 135–145)

## 2019-07-19 LAB — ABO/RH: ABO/RH(D): O POS

## 2019-07-19 LAB — SURGICAL PCR SCREEN
MRSA, PCR: NEGATIVE
Staphylococcus aureus: NEGATIVE

## 2019-07-19 NOTE — Progress Notes (Signed)
COVID Vaccine Completed:yes Date COVID Vaccine completed:04/14/19 COVID vaccine manufacturer: [place]   PCP - Dr. Mateo Flow Cardiologist -   Chest x-ray -  EKG -  Stress Test -  ECHO -  Cardiac Cath -   Sleep Study -  CPAP -   Fasting Blood Sugar -  Checks Blood Sugar _____ times a day  Blood Thinner Instructions: Aspirin Instructions: Last Dose:  Anesthesia review:   Patient denies shortness of breath, fever, cough and chest pain at PAT appointment   Patient verbalized understanding of instructions that were given to them at the PAT appointment. Patient was also instructed that they will need to review over the PAT instructions again at home before surgery.

## 2019-07-19 NOTE — Patient Instructions (Addendum)
DUE TO COVID-19 ONLY ONE VISITOR IS ALLOWED TO COME WITH YOU AND STAY IN THE WAITING ROOM ONLY DURING PRE OP AND PROCEDURE DAY OF SURGERY. THE 1 VISITOR MAY VISIT WITH YOU AFTER SURGERY IN YOUR PRIVATE ROOM DURING VISITING HOURS ONLY!  YOU NEED TO HAVE A COVID 19 TEST ON: 07/28/19 @ 12:00 pm, THIS TEST MUST BE DONE BEFORE SURGERY, COME  [place] , [address].  ([place]) ONCE YOUR COVID TEST IS COMPLETED, PLEASE BEGIN THE QUARANTINE INSTRUCTIONS AS OUTLINED IN YOUR HANDOUT.                Carol Green   Your procedure is scheduled on: 08/01/19   Report to [facility] Main  Entrance   Report to admitting at: 10:30 AM     Call this number if you have problems the morning of surgery [phone]    Remember:   NO SOLID FOOD AFTER MIDNIGHT THE NIGHT PRIOR TO SURGERY. NOTHING BY MOUTH EXCEPT CLEAR LIQUIDS UNTIL: 10:00 am . PLEASE FINISH ENSURE DRINK PER SURGEON ORDER  WHICH NEEDS TO BE COMPLETED AT: 10:00 am .   CLEAR LIQUID DIET   Foods Allowed                                                                     Foods Excluded  Coffee and tea, regular and decaf                             liquids that you cannot  Plain Jell-O any favor except red or purple                                           see through such as: Fruit ices (not with fruit pulp)                                     milk, soups, orange juice  Iced Popsicles                                    All solid food Carbonated beverages, regular and diet                                    Cranberry, grape and apple juices Sports drinks like Gatorade Lightly seasoned clear broth or consume(fat free) Sugar, honey syrup  Sample Menu Breakfast                                Lunch                                     Supper Cranberry juice  Beef broth                            Chicken broth Jell-O                                     Grape juice                            Apple juice Coffee or tea                        Jell-O                                      Popsicle                                                Coffee or tea                        Coffee or tea  _____________________________________________________________________  BRUSH YOUR TEETH MORNING OF SURGERY AND RINSE YOUR MOUTH OUT, NO CHEWING GUM CANDY OR MINTS.     Take these medicines the morning of surgery with A SIP OF WATER: fingolimod,gabapentin,loratadine,pantoprazole.  DO NOT TAKE ANY DIABETIC MEDICATIONS DAY OF YOUR SURGERY                               You may not have any metal on your body including hair pins and              piercings  Do not wear jewelry, make-up, lotions, powders or perfumes, deodorant             Do not wear nail polish on your fingernails.  Do not shave  48 hours prior to surgery.             Do not bring valuables to the hospital. [place].  Contacts, dentures or bridgework may not be worn into surgery.  Leave suitcase in the car. After surgery it may be brought to your room.     Patients discharged the day of surgery will not be allowed to drive home. IF YOU ARE HAVING SURGERY AND GOING HOME THE SAME DAY, YOU MUST HAVE AN ADULT TO DRIVE YOU HOME AND BE WITH YOU FOR 24 HOURS. YOU MAY GO HOME BY TAXI OR UBER OR ORTHERWISE, BUT AN ADULT MUST ACCOMPANY YOU HOME AND STAY WITH YOU FOR 24 HOURS.  Name and phone number of your driver:  Special Instructions: N/A              Please read over the following fact sheets you were given: _____________________________________________________________________  [facility] - Preparing for Surgery Before surgery, you can play an important role.  Because skin is not sterile, your skin needs to be as free of germs as possible.  You can reduce the number of germs on your skin by  washing with CHG (chlorahexidine gluconate) soap before surgery.  CHG is an antiseptic  cleaner which kills germs and bonds with the skin to continue killing germs even after washing. Please DO NOT use if you have an allergy to CHG or antibacterial soaps.  If your skin becomes reddened/irritated stop using the CHG and inform your nurse when you arrive at Short Stay. Do not shave (including legs and underarms) for at least 48 hours prior to the first CHG shower.  You may shave your face/neck. Please follow these instructions carefully:  1.  Shower with CHG Soap the night before surgery and the  morning of Surgery.  2.  If you choose to wash your hair, wash your hair first as usual with your  normal  shampoo.  3.  After you shampoo, rinse your hair and body thoroughly to remove the  shampoo.                           4.  Use CHG as you would any other liquid soap.  You can apply chg directly  to the skin and wash                       Gently with a scrungie or clean washcloth.  5.  Apply the CHG Soap to your body ONLY FROM THE NECK DOWN.   Do not use on face/ open                           Wound or open sores. Avoid contact with eyes, ears mouth and genitals (private parts).                       Wash face,  Genitals (private parts) with your normal soap.             6.  Wash thoroughly, paying special attention to the area where your surgery  will be performed.  7.  Thoroughly rinse your body with warm water from the neck down.  8.  DO NOT shower/wash with your normal soap after using and rinsing off  the CHG Soap.                9.  Pat yourself dry with a clean towel.            10.  Wear clean pajamas.            11.  Place clean sheets on your bed the night of your first shower and do not  sleep with pets. Day of Surgery : Do not apply any lotions/deodorants the morning of surgery.  Please wear clean clothes to the hospital/surgery center.  FAILURE TO FOLLOW THESE INSTRUCTIONS MAY RESULT IN THE CANCELLATION OF YOUR SURGERY PATIENT  SIGNATURE_________________________________  NURSE SIGNATURE__________________________________  ________________________________________________________________________   [name]  An incentive spirometer is a tool that can help keep your lungs clear and active. This tool measures how well you are filling your lungs with each breath. Taking long deep breaths may help reverse or decrease the chance of developing breathing (pulmonary) problems (especially infection) following:  A long period of time when you are unable to move or be active. BEFORE THE PROCEDURE   If the spirometer includes an indicator to show your best effort, your nurse or respiratory therapist will set it to a desired goal.  If possible, sit up  straight or lean slightly forward. Try not to slouch.  Hold the incentive spirometer in an upright position. INSTRUCTIONS FOR USE  1. Sit on the edge of your bed if possible, or sit up as far as you can in bed or on a chair. 2. Hold the incentive spirometer in an upright position. 3. Breathe out normally. 4. Place the mouthpiece in your mouth and seal your lips tightly around it. 5. Breathe in slowly and as deeply as possible, raising the piston or the ball toward the top of the column. 6. Hold your breath for 3-5 seconds or for as long as possible. Allow the piston or ball to fall to the bottom of the column. 7. Remove the mouthpiece from your mouth and breathe out normally. 8. Rest for a few seconds and repeat Steps 1 through 7 at least 10 times every 1-2 hours when you are awake. Take your time and take a few normal breaths between deep breaths. 9. The spirometer may include an indicator to show your best effort. Use the indicator as a goal to work toward during each repetition. 10. After each set of 10 deep breaths, practice coughing to be sure your lungs are clear. If you have an incision (the cut made at the time of surgery), support your incision when coughing  by placing a pillow or rolled up towels firmly against it. Once you are able to get out of bed, walk around indoors and cough well. You may stop using the incentive spirometer when instructed by your caregiver.  RISKS AND COMPLICATIONS  Take your time so you do not get dizzy or light-headed.  If you are in pain, you may need to take or ask for pain medication before doing incentive spirometry. It is harder to take a deep breath if you are having pain. AFTER USE  Rest and breathe slowly and easily.  It can be helpful to keep track of a log of your progress. Your caregiver can provide you with a simple table to help with this. If you are using the spirometer at home, follow these instructions: [place] IF:   You are having difficultly using the spirometer.  You have trouble using the spirometer as often as instructed.  Your pain medication is not giving enough relief while using the spirometer.  You develop fever of 100.5 F (38.1 C) or higher. SEEK IMMEDIATE MEDICAL CARE IF:   You cough up bloody sputum that had not been present before.  You develop fever of 102 F (38.9 C) or greater.  You develop worsening pain at or near the incision site. MAKE SURE YOU:   Understand these instructions.  Will watch your condition.  Will get help right away if you are not doing well or get worse. Document Released: 06/01/2006 Document Revised: 04/13/2011 Document Reviewed: 08/02/2006 [facility] Patient Information 2014 [place], [place].   ________________________________________________________________________

## 2019-07-24 NOTE — H&P (Signed)
TOTAL KNEE ADMISSION H&P  Patient is being admitted for left total knee arthroplasty.  Subjective:  Chief Complaint:    Left knee primary OA / pain  HPI: Carol Green, 69 y.o. female, has a history of pain and functional disability in the left knee due to arthritis and has failed non-surgical conservative treatments for greater than 12 weeks to include NSAID's and/or analgesics, use of assistive devices and activity modification.  Onset of symptoms was gradual, starting <1 year ago with rapidlly worsening course since that time. The patient noted no past surgery on the left knee(s).  Patient currently rates pain in the left knee(s) at 9 out of 10 with activity. Patient has night pain, worsening of pain with activity and weight bearing, pain that interferes with activities of daily living, pain with passive range of motion, crepitus and joint swelling.  Patient has evidence of periarticular osteophytes and joint space narrowing by imaging studies. There is no active infection.  Risks, benefits and expectations were discussed with the patient.  Risks including but not limited to the risk of anesthesia, blood clots, nerve damage, blood vessel damage, failure of the prosthesis, infection and up to and including death.  Patient understand the risks, benefits and expectations and wishes to proceed with surgery.   PCP: Mateo Flow, MD  D/C Plans:       Home   Post-op Meds:       No Rx given  Tranexamic Acid:      To be given - IV   Decadron:      Is to be given  FYI:      ASA  Dilaudid (codeine allergy)  DME:   Pt already has equipment   PT:   [place]: [place]    Patient Active Problem List   Diagnosis Date Noted   DDD (degenerative disc disease), cervical 01/28/2017   DDD (degenerative disc disease), lumbar 01/28/2017   Primary osteoarthritis of both hands 01/28/2017   Multiple sclerosis ([place]) 01/28/2017   History of hypertension 01/28/2017   History  of gastroesophageal reflux (GERD) 01/28/2017   History of diverticulosis 01/28/2017   History of cholecystectomy 01/28/2017   History of appendectomy 01/28/2017   Chronic cystitis 01/28/2017   Piriformis syndrome of right side 01/28/2017   Olecranon bursitis of right elbow 01/28/2017   Varicose veins of lower extremities with other complications 36/64/4034   Past Medical History:  Diagnosis Date   Acid reflux    Alopecia    Arthritis    Chronic cystitis    Diverticulosis    Hypertension    Multiple sclerosis ([place])    Varicose veins     Past Surgical History:  Procedure Laterality Date   [place]   FRACTURE SURGERY Left 2002   OTHER SURGICAL HISTORY Left 2004   removal fatty tumor left arm   SHOULDER SURGERY Bilateral 2003 - right; 2008 - left   TUBAL LIGATION  1977    No current facility-administered medications for this encounter.   Current Outpatient Medications  Medication Sig Dispense Refill Last Dose   aspirin 81 MG chewable tablet Chew 162 mg by mouth daily.      atorvastatin (LIPITOR) 20 MG tablet Take 40 mg by mouth at bedtime.      CRANBERRY PO Take 1 tablet by mouth daily at 12 noon.      Fingolimod HCl 0.5 MG CAPS Take 0.5 mg by  mouth daily.       gabapentin (NEURONTIN) 600 MG tablet Take 600 mg by mouth in the morning, at noon, in the evening, and at bedtime.      lisinopril-hydrochlorothiazide (ZESTORETIC) 20-12.5 MG tablet Take 1 tablet by mouth daily.      loratadine (CLARITIN) 10 MG tablet Take 10 mg by mouth at bedtime.      meloxicam (MOBIC) 7.5 MG tablet Take 7.5 mg by mouth in the morning and at bedtime. Breakfast & Supper      Multiple Vitamin (MULTIVITAMIN WITH MINERALS) TABS tablet Take 1 tablet by mouth daily at 12 noon.      Multiple Vitamins-Minerals (HAIR/SKIN/NAILS/BIOTIN PO) Take 1 tablet by mouth daily with supper.      pantoprazole (PROTONIX) 40 MG tablet Take 40 mg  by mouth 2 (two) times daily. Before breakfast & before supper      Allergies  Allergen Reactions   Codeine Anaphylaxis and Other (See Comments)   Latex     Other reaction(s): Other (See Comments)    Tape Rash    Pt can use paper tape    Social History   Tobacco Use   Smoking status: Never Smoker   Smokeless tobacco: Never Used  Substance Use Topics   Alcohol use: No    Family History  Problem Relation Age of Onset   Cancer Sister        breast   Deep vein thrombosis Brother    Other Brother        varicose veins   Diabetes Sister    Deep vein thrombosis Sister    Hypertension Sister    Hyperlipidemia Sister    Other Sister        varicose veins   Breast cancer Sister    Cancer Mother        ovarian   Other Father        varicose veins   Deep vein thrombosis Father      Review of Systems  Constitutional: Negative.   HENT: Negative.   Eyes: Negative.   Respiratory: Negative.   Cardiovascular: Negative.   Gastrointestinal: Negative.   Genitourinary: Positive for frequency.  Musculoskeletal: Positive for joint pain.  Skin: Negative.   Neurological: Negative.   Endo/Heme/Allergies: Negative.   Psychiatric/Behavioral: Negative.       Objective:  Physical Exam  Constitutional: She is oriented to person, place, and time. She appears well-developed.  HENT:  Head: Normocephalic.  Eyes: Pupils are equal, round, and reactive to light.  Neck: No JVD present. No tracheal deviation present. No thyromegaly present.  Cardiovascular: Normal rate and regular rhythm.  Respiratory: Effort normal and breath sounds normal. No respiratory distress. She has no wheezes.  GI: Soft. There is no abdominal tenderness. There is no guarding.  Musculoskeletal:     Cervical back: Neck supple.     Left knee: Swelling and bony tenderness present. No erythema, ecchymosis or lacerations. Decreased range of motion. Tenderness present.  Lymphadenopathy:    She has  no cervical adenopathy.  Neurological: She is alert and oriented to person, place, and time.  Skin: Skin is warm and dry.      Labs:  Estimated body mass index is 36.44 kg/m as calculated from the following:   Height as of 07/19/19: 5\' 5"  (1.651 m).   Weight as of 07/19/19: 99.3 kg.   Imaging Review Plain radiographs demonstrate severe degenerative joint disease of the left knee(s).  The bone quality appears to be good  for age and reported activity level.      Assessment/Plan:  End stage arthritis, left knee   The patient history, physical examination, clinical judgment of the provider and imaging studies are consistent with end stage degenerative joint disease of the left knee(s) and total knee arthroplasty is deemed medically necessary. The treatment options including medical management, injection therapy arthroscopy and arthroplasty were discussed at length. The risks and benefits of total knee arthroplasty were presented and reviewed. The risks due to aseptic loosening, infection, stiffness, patella tracking problems, thromboembolic complications and other imponderables were discussed. The patient acknowledged the explanation, agreed to proceed with the plan and consent was signed. Patient is being admitted for treatment for surgery, pain control, PT, OT, prophylactic antibiotics, VTE prophylaxis, progressive ambulation and ADL's and discharge planning. The patient is planning to be discharged home.      Patient's anticipated LOS is less than 2 midnights, meeting these requirements: - Lives within 1 hour of care - Has a competent adult at home to recover with post-op recover - NO history of  - Chronic pain requiring opiods  - Diabetes  - Coronary Artery Disease  - Heart failure  - Heart attack  - Stroke  - DVT/VTE  - Cardiac arrhythmia  - Respiratory Failure/COPD  - Renal failure  - Anemia  - Advanced Liver disease

## 2019-07-28 ENCOUNTER — Other Ambulatory Visit (HOSPITAL_COMMUNITY)
Admission: RE | Admit: 2019-07-28 | Discharge: 2019-07-28 | Disposition: A | Discharge status: Home / Self Care | Payer: Medicare Other | Source: Ambulatory Visit | Attending: Orthopedic Surgery | Admitting: Orthopedic Surgery

## 2019-07-28 DIAGNOSIS — Z01812 Encounter for preprocedural laboratory examination: Secondary | ICD-10-CM | POA: Insufficient documentation

## 2019-07-28 DIAGNOSIS — Z20822 Contact with and (suspected) exposure to covid-19: Secondary | ICD-10-CM | POA: Diagnosis not present

## 2019-07-28 LAB — SARS CORONAVIRUS 2 (TAT 6-24 HRS): SARS Coronavirus 2: NEGATIVE

## 2019-08-01 ENCOUNTER — Ambulatory Visit (HOSPITAL_COMMUNITY): Payer: Medicare Other | Admitting: Anesthesiology

## 2019-08-01 ENCOUNTER — Observation Stay (HOSPITAL_COMMUNITY)
Admission: RE | Admit: 2019-08-01 | Discharge: 2019-08-02 | Disposition: A | Discharge status: Home / Self Care | Payer: Medicare Other | Source: Other Acute Inpatient Hospital | Attending: Orthopedic Surgery | Admitting: Orthopedic Surgery

## 2019-08-01 ENCOUNTER — Encounter (HOSPITAL_COMMUNITY)
Admission: RE | Disposition: A | Discharge status: Home / Self Care | Payer: Self-pay | Source: Other Acute Inpatient Hospital | Attending: Orthopedic Surgery

## 2019-08-01 ENCOUNTER — Other Ambulatory Visit: Payer: Self-pay

## 2019-08-01 ENCOUNTER — Encounter (HOSPITAL_COMMUNITY): Payer: Self-pay | Admitting: Orthopedic Surgery

## 2019-08-01 DIAGNOSIS — M5136 Other intervertebral disc degeneration, lumbar region: Secondary | ICD-10-CM | POA: Insufficient documentation

## 2019-08-01 DIAGNOSIS — Z9049 Acquired absence of other specified parts of digestive tract: Secondary | ICD-10-CM | POA: Diagnosis not present

## 2019-08-01 DIAGNOSIS — M719 Bursopathy, unspecified: Secondary | ICD-10-CM | POA: Insufficient documentation

## 2019-08-01 DIAGNOSIS — Z8349 Family history of other endocrine, nutritional and metabolic diseases: Secondary | ICD-10-CM | POA: Diagnosis not present

## 2019-08-01 DIAGNOSIS — Z79899 Other long term (current) drug therapy: Secondary | ICD-10-CM | POA: Diagnosis not present

## 2019-08-01 DIAGNOSIS — Z803 Family history of malignant neoplasm of breast: Secondary | ICD-10-CM | POA: Insufficient documentation

## 2019-08-01 DIAGNOSIS — Z8041 Family history of malignant neoplasm of ovary: Secondary | ICD-10-CM | POA: Insufficient documentation

## 2019-08-01 DIAGNOSIS — G35 Multiple sclerosis: Secondary | ICD-10-CM | POA: Insufficient documentation

## 2019-08-01 DIAGNOSIS — M5030 Other cervical disc degeneration, unspecified cervical region: Secondary | ICD-10-CM | POA: Insufficient documentation

## 2019-08-01 DIAGNOSIS — Z90710 Acquired absence of both cervix and uterus: Secondary | ICD-10-CM | POA: Diagnosis not present

## 2019-08-01 DIAGNOSIS — Z8249 Family history of ischemic heart disease and other diseases of the circulatory system: Secondary | ICD-10-CM | POA: Insufficient documentation

## 2019-08-01 DIAGNOSIS — M1712 Unilateral primary osteoarthritis, left knee: Principal | ICD-10-CM | POA: Insufficient documentation

## 2019-08-01 DIAGNOSIS — I10 Essential (primary) hypertension: Secondary | ICD-10-CM | POA: Diagnosis not present

## 2019-08-01 DIAGNOSIS — E669 Obesity, unspecified: Secondary | ICD-10-CM | POA: Diagnosis not present

## 2019-08-01 DIAGNOSIS — Z888 Allergy status to other drugs, medicaments and biological substances status: Secondary | ICD-10-CM | POA: Insufficient documentation

## 2019-08-01 DIAGNOSIS — Z6836 Body mass index (BMI) 36.0-36.9, adult: Secondary | ICD-10-CM | POA: Insufficient documentation

## 2019-08-01 DIAGNOSIS — Z791 Long term (current) use of non-steroidal anti-inflammatories (NSAID): Secondary | ICD-10-CM | POA: Insufficient documentation

## 2019-08-01 DIAGNOSIS — Z833 Family history of diabetes mellitus: Secondary | ICD-10-CM | POA: Diagnosis not present

## 2019-08-01 DIAGNOSIS — Z7982 Long term (current) use of aspirin: Secondary | ICD-10-CM | POA: Diagnosis not present

## 2019-08-01 DIAGNOSIS — Z96652 Presence of left artificial knee joint: Secondary | ICD-10-CM

## 2019-08-01 DIAGNOSIS — G8918 Other acute postprocedural pain: Secondary | ICD-10-CM | POA: Diagnosis not present

## 2019-08-01 DIAGNOSIS — Z91040 Latex allergy status: Secondary | ICD-10-CM | POA: Diagnosis not present

## 2019-08-01 DIAGNOSIS — K219 Gastro-esophageal reflux disease without esophagitis: Secondary | ICD-10-CM | POA: Insufficient documentation

## 2019-08-01 DIAGNOSIS — Z885 Allergy status to narcotic agent status: Secondary | ICD-10-CM | POA: Insufficient documentation

## 2019-08-01 HISTORY — PX: TOTAL KNEE ARTHROPLASTY: SHX125

## 2019-08-01 SURGERY — ARTHROPLASTY, KNEE, TOTAL
Anesthesia: Spinal | Site: Knee | Laterality: Left

## 2019-08-01 MED ORDER — MIDAZOLAM HCL 2 MG/2ML IJ SOLN
1.0000 mg | INTRAMUSCULAR | Status: DC
  Filled 2019-08-01: qty 2

## 2019-08-01 MED ORDER — SODIUM CHLORIDE 0.9 % IR SOLN
Status: DC | PRN
  Administered 2019-08-01: 1000 mL

## 2019-08-01 MED ORDER — MAGNESIUM CITRATE PO SOLN: 1.0000 | Freq: Once | ORAL | Status: DC | PRN

## 2019-08-01 MED ORDER — STERILE WATER FOR IRRIGATION IR SOLN
Status: DC | PRN
  Administered 2019-08-01 (×2): 1000 mL

## 2019-08-01 MED ORDER — PANTOPRAZOLE SODIUM 40 MG PO TBEC
40.0000 mg | DELAYED_RELEASE_TABLET | Freq: Two times a day (BID) | ORAL | Status: DC
  Administered 2019-08-01 – 2019-08-02 (×2): 40 mg via ORAL
  Filled 2019-08-01 (×2): qty 1

## 2019-08-01 MED ORDER — FENTANYL CITRATE (PF) 100 MCG/2ML IJ SOLN: 25.0000 ug | INTRAMUSCULAR | Status: DC | PRN

## 2019-08-01 MED ORDER — FERROUS SULFATE 325 (65 FE) MG PO TABS
325.0000 mg | ORAL_TABLET | Freq: Two times a day (BID) | ORAL | Status: DC
  Administered 2019-08-02: 325 mg via ORAL
  Filled 2019-08-01: qty 1

## 2019-08-01 MED ORDER — CEFAZOLIN SODIUM-DEXTROSE 2-4 GM/100ML-% IV SOLN
2.0000 g | INTRAVENOUS | Status: AC
  Administered 2019-08-01: 2 g via INTRAVENOUS
  Filled 2019-08-01: qty 100

## 2019-08-01 MED ORDER — BISACODYL 10 MG RE SUPP: 10.0000 mg | Freq: Every day | RECTAL | Status: DC | PRN

## 2019-08-01 MED ORDER — TRANEXAMIC ACID-NACL 1000-0.7 MG/100ML-% IV SOLN
1000.0000 mg | Freq: Once | INTRAVENOUS | Status: AC
  Administered 2019-08-01: 1000 mg via INTRAVENOUS
  Filled 2019-08-01: qty 100

## 2019-08-01 MED ORDER — POLYETHYLENE GLYCOL 3350 17 G PO PACK
17.0000 g | PACK | Freq: Two times a day (BID) | ORAL | Status: DC
  Administered 2019-08-01 – 2019-08-02 (×2): 17 g via ORAL
  Filled 2019-08-01 (×2): qty 1

## 2019-08-01 MED ORDER — ONDANSETRON HCL 4 MG/2ML IJ SOLN: 4.0000 mg | Freq: Once | INTRAMUSCULAR | Status: DC | PRN

## 2019-08-01 MED ORDER — KETOROLAC TROMETHAMINE 30 MG/ML IJ SOLN
INTRAMUSCULAR | Status: AC
  Filled 2019-08-01: qty 1

## 2019-08-01 MED ORDER — ORAL CARE MOUTH RINSE: 15.0000 mL | Freq: Once | OROMUCOSAL | Status: AC

## 2019-08-01 MED ORDER — TRANEXAMIC ACID-NACL 1000-0.7 MG/100ML-% IV SOLN
1000.0000 mg | INTRAVENOUS | Status: AC
  Administered 2019-08-01: 1000 mg via INTRAVENOUS
  Filled 2019-08-01: qty 100

## 2019-08-01 MED ORDER — CHLORHEXIDINE GLUCONATE 0.12 % MT SOLN
15.0000 mL | Freq: Once | OROMUCOSAL | Status: AC
  Administered 2019-08-01: 15 mL via OROMUCOSAL

## 2019-08-01 MED ORDER — METOCLOPRAMIDE HCL 5 MG/ML IJ SOLN: 5.0000 mg | Freq: Three times a day (TID) | INTRAMUSCULAR | Status: DC | PRN

## 2019-08-01 MED ORDER — FENTANYL CITRATE (PF) 100 MCG/2ML IJ SOLN
50.0000 ug | INTRAMUSCULAR | Status: DC
  Administered 2019-08-01: 100 ug via INTRAVENOUS
  Filled 2019-08-01: qty 2

## 2019-08-01 MED ORDER — ALUM & MAG HYDROXIDE-SIMETH 200-200-20 MG/5ML PO SUSP: 15.0000 mL | ORAL | Status: DC | PRN

## 2019-08-01 MED ORDER — HYDROMORPHONE HCL 1 MG/ML IJ SOLN: 0.5000 mg | INTRAMUSCULAR | Status: DC | PRN

## 2019-08-01 MED ORDER — BUPIVACAINE IN DEXTROSE 0.75-8.25 % IT SOLN
INTRATHECAL | Status: DC | PRN
  Administered 2019-08-01: 1.6 mL via INTRATHECAL

## 2019-08-01 MED ORDER — KETOROLAC TROMETHAMINE 30 MG/ML IJ SOLN
INTRAMUSCULAR | Status: DC | PRN
  Administered 2019-08-01: 30 mg

## 2019-08-01 MED ORDER — PROPOFOL 1000 MG/100ML IV EMUL
INTRAVENOUS | Status: AC
  Filled 2019-08-01: qty 100

## 2019-08-01 MED ORDER — DEXAMETHASONE SODIUM PHOSPHATE 10 MG/ML IJ SOLN
10.0000 mg | Freq: Once | INTRAMUSCULAR | Status: AC
  Administered 2019-08-02: 10 mg via INTRAVENOUS
  Filled 2019-08-01: qty 1

## 2019-08-01 MED ORDER — ATORVASTATIN CALCIUM 40 MG PO TABS
40.0000 mg | ORAL_TABLET | Freq: Every day | ORAL | Status: DC
  Administered 2019-08-01: 40 mg via ORAL
  Filled 2019-08-01: qty 1

## 2019-08-01 MED ORDER — PHENYLEPHRINE HCL-NACL 10-0.9 MG/250ML-% IV SOLN
INTRAVENOUS | Status: DC | PRN
  Administered 2019-08-01: 40 ug/min via INTRAVENOUS

## 2019-08-01 MED ORDER — ROPIVACAINE HCL 7.5 MG/ML IJ SOLN
INTRAMUSCULAR | Status: DC | PRN
Start: 2019-08-01 — End: 2019-08-01
  Administered 2019-08-01 (×4): 5 mL via PERINEURAL

## 2019-08-01 MED ORDER — ASPIRIN 81 MG PO CHEW
81.0000 mg | CHEWABLE_TABLET | Freq: Two times a day (BID) | ORAL | Status: DC
  Administered 2019-08-01 – 2019-08-02 (×2): 81 mg via ORAL
  Filled 2019-08-01 (×2): qty 1

## 2019-08-01 MED ORDER — METHOCARBAMOL 500 MG IVPB - SIMPLE MED
500.0000 mg | Freq: Four times a day (QID) | INTRAVENOUS | Status: DC | PRN
  Filled 2019-08-01: qty 50

## 2019-08-01 MED ORDER — DOCUSATE SODIUM 100 MG PO CAPS
100.0000 mg | ORAL_CAPSULE | Freq: Two times a day (BID) | ORAL | Status: DC
  Administered 2019-08-01 – 2019-08-02 (×2): 100 mg via ORAL
  Filled 2019-08-01 (×2): qty 1

## 2019-08-01 MED ORDER — HYDROMORPHONE HCL 2 MG PO TABS
2.0000 mg | ORAL_TABLET | ORAL | Status: DC | PRN
  Administered 2019-08-01 – 2019-08-02 (×3): 2 mg via ORAL
  Filled 2019-08-01 (×3): qty 1

## 2019-08-01 MED ORDER — POVIDONE-IODINE 10 % EX SWAB
2.0000 "application " | Freq: Once | CUTANEOUS | Status: AC
  Administered 2019-08-01: 2 via TOPICAL

## 2019-08-01 MED ORDER — SODIUM CHLORIDE (PF) 0.9 % IJ SOLN
INTRAMUSCULAR | Status: DC | PRN
  Administered 2019-08-01: 30 mL

## 2019-08-01 MED ORDER — ONDANSETRON HCL 4 MG/2ML IJ SOLN: 4.0000 mg | Freq: Four times a day (QID) | INTRAMUSCULAR | Status: DC | PRN

## 2019-08-01 MED ORDER — PROPOFOL 10 MG/ML IV BOLUS
INTRAVENOUS | Status: AC
  Filled 2019-08-01: qty 20

## 2019-08-01 MED ORDER — MENTHOL 3 MG MT LOZG: 1.0000 | LOZENGE | OROMUCOSAL | Status: DC | PRN

## 2019-08-01 MED ORDER — DEXAMETHASONE SODIUM PHOSPHATE 10 MG/ML IJ SOLN
10.0000 mg | Freq: Once | INTRAMUSCULAR | Status: AC
  Administered 2019-08-01: 8 mg via INTRAVENOUS

## 2019-08-01 MED ORDER — ONDANSETRON HCL 4 MG/2ML IJ SOLN
INTRAMUSCULAR | Status: DC | PRN
  Administered 2019-08-01: 4 mg via INTRAVENOUS

## 2019-08-01 MED ORDER — ROPIVACAINE HCL 5 MG/ML IJ SOLN
INTRAMUSCULAR | Status: DC | PRN
Start: 2019-08-01 — End: 2019-08-01
  Administered 2019-08-01 (×2): 5 mL via PERINEURAL

## 2019-08-01 MED ORDER — PROPOFOL 500 MG/50ML IV EMUL
INTRAVENOUS | Status: DC | PRN
  Administered 2019-08-01: 50 ug/kg/min via INTRAVENOUS

## 2019-08-01 MED ORDER — PHENOL 1.4 % MT LIQD: 1.0000 | OROMUCOSAL | Status: DC | PRN

## 2019-08-01 MED ORDER — BUPIVACAINE-EPINEPHRINE (PF) 0.25% -1:200000 IJ SOLN
INTRAMUSCULAR | Status: DC | PRN
  Administered 2019-08-01: 30 mL

## 2019-08-01 MED ORDER — CELECOXIB 200 MG PO CAPS
200.0000 mg | ORAL_CAPSULE | Freq: Two times a day (BID) | ORAL | Status: DC
  Administered 2019-08-01 – 2019-08-02 (×2): 200 mg via ORAL
  Filled 2019-08-01 (×2): qty 1

## 2019-08-01 MED ORDER — CEFAZOLIN SODIUM-DEXTROSE 2-4 GM/100ML-% IV SOLN
2.0000 g | Freq: Four times a day (QID) | INTRAVENOUS | Status: AC
  Administered 2019-08-01 – 2019-08-02 (×2): 2 g via INTRAVENOUS
  Filled 2019-08-01 (×2): qty 100

## 2019-08-01 MED ORDER — METHOCARBAMOL 500 MG PO TABS
500.0000 mg | ORAL_TABLET | Freq: Four times a day (QID) | ORAL | Status: DC | PRN
  Administered 2019-08-01 – 2019-08-02 (×2): 500 mg via ORAL
  Filled 2019-08-01 (×2): qty 1

## 2019-08-01 MED ORDER — CLONIDINE HCL (ANALGESIA) 100 MCG/ML EP SOLN
EPIDURAL | Status: DC | PRN
Start: 2019-08-01 — End: 2019-08-01
  Administered 2019-08-01: 100 ug

## 2019-08-01 MED ORDER — LACTATED RINGERS IV SOLN: INTRAVENOUS | Status: DC

## 2019-08-01 MED ORDER — SODIUM CHLORIDE (PF) 0.9 % IJ SOLN
INTRAMUSCULAR | Status: AC
  Filled 2019-08-01: qty 50

## 2019-08-01 MED ORDER — DIPHENHYDRAMINE HCL 12.5 MG/5ML PO ELIX: 12.5000 mg | ORAL_SOLUTION | ORAL | Status: DC | PRN

## 2019-08-01 MED ORDER — ACETAMINOPHEN 500 MG PO TABS
1000.0000 mg | ORAL_TABLET | Freq: Four times a day (QID) | ORAL | Status: DC
  Administered 2019-08-01 – 2019-08-02 (×4): 1000 mg via ORAL
  Filled 2019-08-01 (×4): qty 2

## 2019-08-01 MED ORDER — LORATADINE 10 MG PO TABS: 10.0000 mg | ORAL_TABLET | Freq: Every day | ORAL | Status: DC

## 2019-08-01 MED ORDER — METOCLOPRAMIDE HCL 5 MG PO TABS: 5.0000 mg | ORAL_TABLET | Freq: Three times a day (TID) | ORAL | Status: DC | PRN

## 2019-08-01 MED ORDER — BUPIVACAINE-EPINEPHRINE 0.25% -1:200000 IJ SOLN
INTRAMUSCULAR | Status: AC
  Filled 2019-08-01: qty 1

## 2019-08-01 MED ORDER — ONDANSETRON HCL 4 MG PO TABS: 4.0000 mg | ORAL_TABLET | Freq: Four times a day (QID) | ORAL | Status: DC | PRN

## 2019-08-01 MED ORDER — SODIUM CHLORIDE 0.9 % IV SOLN: INTRAVENOUS | Status: DC

## 2019-08-01 MED ORDER — GABAPENTIN 300 MG PO CAPS
600.0000 mg | ORAL_CAPSULE | Freq: Three times a day (TID) | ORAL | Status: DC
  Administered 2019-08-01 – 2019-08-02 (×3): 600 mg via ORAL
  Filled 2019-08-01 (×3): qty 2

## 2019-08-01 MED ORDER — MEPERIDINE HCL 50 MG/ML IJ SOLN: 6.2500 mg | INTRAMUSCULAR | Status: DC | PRN

## 2019-08-01 SURGICAL SUPPLY — 64 items
ADH SKN CLS APL DERMABOND .7 (GAUZE/BANDAGES/DRESSINGS) ×1
ATTUNE MED ANAT PAT 35 KNEE (Knees) ×1 IMPLANT
ATTUNE MED ANAT PAT 35MM KNEE (Knees) ×1 IMPLANT
ATTUNE PSFEM LTSZ5 NARCEM KNEE (Femur) ×2 IMPLANT
ATTUNE PSRP INSR SZ5 6 KNEE (Insert) ×1 IMPLANT
ATTUNE PSRP INSR SZ5 6MM KNEE (Insert) ×1 IMPLANT
BAG SPEC THK2 15X12 ZIP CLS (MISCELLANEOUS)
BAG ZIPLOCK 12X15 (MISCELLANEOUS) IMPLANT
BASEPLATE TIBIAL ROTATING SZ 4 (Knees) ×2 IMPLANT
BLADE SAW SGTL 11.0X1.19X90.0M (BLADE) IMPLANT
BLADE SAW SGTL 13.0X1.19X90.0M (BLADE) ×3 IMPLANT
BLADE SURG SZ10 CARB STEEL (BLADE) ×6 IMPLANT
BNDG ELASTIC 6X5.8 VLCR STR LF (GAUZE/BANDAGES/DRESSINGS) ×3 IMPLANT
BOWL SMART MIX CTS (DISPOSABLE) ×3 IMPLANT
BSPLAT TIB 4 CMNT ROT PLAT STR (Knees) ×1 IMPLANT
CEMENT HV SMART SET (Cement) ×4 IMPLANT
COVER SURGICAL LIGHT HANDLE (MISCELLANEOUS) ×3 IMPLANT
COVER WAND RF STERILE (DRAPES) IMPLANT
CUFF TOURN SGL QUICK 34 (TOURNIQUET CUFF) ×3
CUFF TRNQT CYL 34X4.125X (TOURNIQUET CUFF) ×1 IMPLANT
DECANTER SPIKE VIAL GLASS SM (MISCELLANEOUS) ×6 IMPLANT
DERMABOND ADVANCED (GAUZE/BANDAGES/DRESSINGS) ×2
DERMABOND ADVANCED .7 DNX12 (GAUZE/BANDAGES/DRESSINGS) ×1 IMPLANT
DRAPE U-SHAPE 47X51 STRL (DRAPES) ×3 IMPLANT
DRESSING AQUACEL AG SP 3.5X10 (GAUZE/BANDAGES/DRESSINGS) ×1 IMPLANT
DRSG AQUACEL AG SP 3.5X10 (GAUZE/BANDAGES/DRESSINGS) ×3
DURAPREP 26ML APPLICATOR (WOUND CARE) ×6 IMPLANT
ELECT REM PT RETURN 15FT ADLT (MISCELLANEOUS) ×3 IMPLANT
GLOVE BIO SURGEON STRL SZ 6 (GLOVE) ×3 IMPLANT
GLOVE BIOGEL PI IND STRL 6.5 (GLOVE) ×1 IMPLANT
GLOVE BIOGEL PI IND STRL 7.5 (GLOVE) ×1 IMPLANT
GLOVE BIOGEL PI IND STRL 8.5 (GLOVE) ×1 IMPLANT
GLOVE BIOGEL PI INDICATOR 6.5 (GLOVE) ×2
GLOVE BIOGEL PI INDICATOR 7.5 (GLOVE) ×2
GLOVE BIOGEL PI INDICATOR 8.5 (GLOVE) ×2
GLOVE ECLIPSE 8.0 STRL XLNG CF (GLOVE) ×3 IMPLANT
GLOVE ORTHO TXT STRL SZ7.5 (GLOVE) ×3 IMPLANT
GOWN STRL REUS W/ TWL LRG LVL3 (GOWN DISPOSABLE) ×1 IMPLANT
GOWN STRL REUS W/TWL 2XL LVL3 (GOWN DISPOSABLE) ×3 IMPLANT
GOWN STRL REUS W/TWL LRG LVL3 (GOWN DISPOSABLE) ×6 IMPLANT
HANDPIECE INTERPULSE COAX TIP (DISPOSABLE) ×3
HOLDER FOLEY CATH W/STRAP (MISCELLANEOUS) IMPLANT
KIT TURNOVER KIT A (KITS) IMPLANT
MANIFOLD NEPTUNE II (INSTRUMENTS) ×3 IMPLANT
NDL SAFETY ECLIPSE 18X1.5 (NEEDLE) IMPLANT
NEEDLE HYPO 18GX1.5 SHARP (NEEDLE)
NS IRRIG 1000ML POUR BTL (IV SOLUTION) ×3 IMPLANT
PACK TOTAL KNEE CUSTOM (KITS) ×3 IMPLANT
PENCIL SMOKE EVACUATOR (MISCELLANEOUS) IMPLANT
PIN DRILL FIX HALF THREAD (BIT) ×2 IMPLANT
PIN FIX SIGMA LCS THRD HI (PIN) ×2 IMPLANT
PROTECTOR NERVE ULNAR (MISCELLANEOUS) ×3 IMPLANT
SET HNDPC FAN SPRY TIP SCT (DISPOSABLE) ×1 IMPLANT
SET PAD KNEE POSITIONER (MISCELLANEOUS) ×3 IMPLANT
SUT MNCRL AB 4-0 PS2 18 (SUTURE) ×3 IMPLANT
SUT STRATAFIX PDS+ 0 24IN (SUTURE) ×3 IMPLANT
SUT VIC AB 1 CT1 36 (SUTURE) ×3 IMPLANT
SUT VIC AB 2-0 CT1 27 (SUTURE) ×9
SUT VIC AB 2-0 CT1 TAPERPNT 27 (SUTURE) ×3 IMPLANT
SYR 3ML LL SCALE MARK (SYRINGE) ×3 IMPLANT
TRAY FOLEY MTR SLVR 16FR STAT (SET/KITS/TRAYS/PACK) ×3 IMPLANT
WATER STERILE IRR 1000ML POUR (IV SOLUTION) ×6 IMPLANT
WRAP KNEE MAXI GEL POST OP (GAUZE/BANDAGES/DRESSINGS) ×3 IMPLANT
YANKAUER SUCT BULB TIP 10FT TU (MISCELLANEOUS) ×3 IMPLANT

## 2019-08-01 NOTE — Anesthesia Preprocedure Evaluation (Signed)
Anesthesia Evaluation  Patient identified by MRN, date of birth, ID band Patient awake    Reviewed: Allergy & Precautions, NPO status , Patient's Chart, lab work & pertinent test results  Airway Mallampati: I       Dental no notable dental hx. (+) Teeth Intact   Pulmonary neg pulmonary ROS,    Pulmonary exam normal breath sounds clear to auscultation       Cardiovascular hypertension, Pt. on medications Normal cardiovascular exam Rhythm:Regular Rate:Normal     Neuro/Psych negative psych ROS   GI/Hepatic Neg liver ROS, GERD  Medicated,  Endo/Other  negative endocrine ROS  Renal/GU negative Renal ROS  negative genitourinary   Musculoskeletal   Abdominal (+) + obese,   Peds  Hematology negative hematology ROS (+)   Anesthesia Other Findings   Reproductive/Obstetrics                             Anesthesia Physical Anesthesia Plan  ASA: II  Anesthesia Plan: Spinal   Post-op Pain Management:  [facility] for Post-op pain   Induction:   PONV Risk Score and Plan: 2 and Ondansetron, Dexamethasone and Midazolam  Airway Management Planned: Nasal Cannula, Natural Airway and Simple Face Mask  Additional Equipment: None  Intra-op Plan:   Post-operative Plan:   Informed Consent: I have reviewed the patients History and Physical, chart, labs and discussed the procedure including the risks, benefits and alternatives for the proposed anesthesia with the patient or authorized representative who has indicated his/her understanding and acceptance.       Plan Discussed with:   Anesthesia Plan Comments:         Anesthesia Quick Evaluation

## 2019-08-01 NOTE — Transfer of Care (Signed)
Immediate Anesthesia Transfer of Care Note  Patient: Carol Green  Procedure(s) Performed: Procedure(s) with comments: TOTAL KNEE ARTHROPLASTY (Left) - 70 mins  Patient Location: PACU  Anesthesia Type:Spinal  Level of Consciousness:  sedated, patient cooperative and responds to stimulation  Airway & Oxygen Therapy:Patient Spontanous Breathing and Patient connected to face mask oxgen  Post-op Assessment:  Report given to PACU RN and Post -op Vital signs reviewed and stable  Post vital signs:  Reviewed and stable  Last Vitals:  Vitals:   08/01/19 1205 08/01/19 1210  BP:    Pulse: (!) 58 65  Resp: 16 15  Temp:    SpO2: 102% 72%    Complications: No apparent anesthesia complications

## 2019-08-01 NOTE — Anesthesia Procedure Notes (Signed)
Spinal  Patient location during procedure: OR Start time: 08/01/2019 12:30 PM End time: 08/01/2019 12:34 PM Reason for block: at surgeon's request Staffing Performed: resident/CRNA  Resident/CRNA: Anne Fu, CRNA Preanesthetic Checklist Completed: patient identified, IV checked, site marked, risks and benefits discussed, surgical consent, monitors and equipment checked, pre-op evaluation and timeout performed Spinal Block Patient position: sitting Prep: DuraPrep Patient monitoring: heart rate, continuous pulse ox and blood pressure Approach: midline Location: L2-3 Injection technique: single-shot Needle Needle type: Pencan  Needle gauge: 24 G Needle length: 9 cm Assessment Sensory level: T6 Additional Notes  Functioning IV was confirmed and monitors were applied. Expiration date of kit checked and confirmed. Sterile prep and drape, including hand hygiene and sterile gloves were used. The patient was positioned and the spine was prepped. The skin was anesthetized with lidocaine.  Free flow of clear CSF was obtained prior to injecting local anesthetic into the CSF X 1 attempt.  The spinal needle aspirated freely following injection.  The needle was carefully withdrawn. Patient tolerated procedure well, without complications. Loss of motor and sensory on exam post injection.

## 2019-08-01 NOTE — Op Note (Signed)
NAME:  Carol Green RECORD NO.:  [id number]                             FACILITY:  [facility]      PHYSICIAN:  Pietro Cassis. Alvan Dame, M.D.  DATE OF BIRTH:  [birth year]      DATE OF PROCEDURE:  08/01/2019                                     OPERATIVE REPORT         PREOPERATIVE DIAGNOSIS:  Left knee osteoarthritis.      POSTOPERATIVE DIAGNOSIS:  Left knee osteoarthritis.      FINDINGS:  The patient was noted to have complete loss of cartilage and   bone-on-bone arthritis with associated osteophytes in the medial and patellofemoral compartments of   the knee with a significant synovitis and associated effusion.  The patient had failed months of conservative treatment including medications, injection therapy, activity modification.     PROCEDURE:  Left total knee replacement.      COMPONENTS USED:  DePuy Attune rotating platform posterior stabilized knee   system, a size 5 N femur, 4 tibia, size 6 mm PS AOX insert, and 35 anatomic patellar   button.      SURGEON:  Pietro Cassis. Alvan Dame, M.D.      ASSISTANT:  Griffith Citron, PA-C.      ANESTHESIA:  Regional and Spinal.      SPECIMENS:  None.      COMPLICATION:  None.      DRAINS:  None.  EBL: <100 cc      TOURNIQUET TIME:   Total Tourniquet Time Documented: Thigh (Left) - 30 minutes Total: Thigh (Left) - 30 minutes  .      The patient was stable to the recovery room.      INDICATION FOR PROCEDURE:  Carol Green is a 69 y.o. female patient of   mine.  The patient had been seen, evaluated, and treated for months conservatively in the   office with medication, activity modification, and injections.  The patient had   radiographic changes of bone-on-bone arthritis with endplate sclerosis and osteophytes noted.  Based on the radiographic changes and failed conservative measures, the patient   decided to proceed with definitive treatment, total knee replacement.  Risks of infection, DVT, component failure, need  for revision surgery, neurovascular injury were reviewed in the office setting.  The postop course was reviewed stressing the efforts to maximize post-operative satisfaction and function.  Consent was obtained for benefit of pain   relief.      PROCEDURE IN DETAIL:  The patient was brought to the operative theater.   Once adequate anesthesia, preoperative antibiotics, 2 gm of Ancef,1 gm of Tranexamic Acid, and 10 mg of Decadron administered, the patient was positioned supine with a left thigh tourniquet placed.  The  left lower extremity was prepped and draped in sterile fashion.  A time-   out was performed identifying the patient, planned procedure, and the appropriate extremity.      The left lower extremity was placed in the [facility] leg holder.  The leg was   exsanguinated, tourniquet elevated to 250 mmHg.  A midline incision was  made followed by median parapatellar arthrotomy.  Following initial   exposure, attention was first directed to the patella.  Precut   measurement was noted to be 22 mm.  I resected down to 13-14 mm and used a   35 anatomic patellar button to restore patellar height as well as cover the cut surface.      The lug holes were drilled and a metal shim was placed to protect the   patella from retractors and saw blade during the procedure.      At this point, attention was now directed to the femur.  The femoral   canal was opened with a drill, irrigated to try to prevent fat emboli.  An   intramedullary rod was passed at 3 degrees valgus, 9 mm of bone was   resected off the distal femur.  Following this resection, the tibia was   subluxated anteriorly.  Using the extramedullary guide, 2 mm of bone was resected off   the proximal medial tibia.  We confirmed the gap would be   stable medially and laterally with a size 5 spacer block as well as confirmed that the tibial cut was perpendicular in the coronal plane, checking with an alignment rod.      Once this was  done, I sized the femur to be a size 5 in the anterior-   posterior dimension, chose a narrow component based on medial and   lateral dimension.  The size 5 rotation block was then pinned in   position anterior referenced using the C-clamp to set rotation.  The   anterior, posterior, and  chamfer cuts were made without difficulty nor   notching making certain that I was along the anterior cortex to help   with flexion gap stability.      The final box cut was made off the lateral aspect of distal femur.      At this point, the tibia was sized to be a size 4.  The size 4 tray was   then pinned in position through the medial third of the tubercle,   drilled, and keel punched.  Trial reduction was now carried with a 5 femur,  4 tibia, a size 6 mm PS insert, and the 35 anatomic patella botton.  The knee was brought to full extension with good flexion stability with the patella   tracking through the trochlea without application of pressure.  Given   all these findings the trial components removed.  Final components were   opened and cement was mixed.  The knee was irrigated with normal saline solution and pulse lavage.  The synovial lining was   then injected with 30 cc of 0.25% Marcaine with epinephrine, 1 cc of Toradol and 30 cc of NS for a total of 61 cc.     Final implants were then cemented onto cleaned and dried cut surfaces of bone with the knee brought to extension with a size 6 mm PS trial insert.      Once the cement had fully cured, excess cement was removed   throughout the knee.  I confirmed that I was satisfied with the range of   motion and stability, and the final size 6 mm PS AOX insert was chosen.  It was   placed into the knee.      The tourniquet had been let down at 30 minutes.  No significant   hemostasis was required.  The extensor mechanism was then reapproximated using #1 Vicryl  and #1 Stratafix sutures with the knee   in flexion.  The   remaining wound was closed  with 2-0 Vicryl and running 4-0 Monocryl.   The knee was cleaned, dried, dressed sterilely using Dermabond and   Aquacel dressing.  The patient was then   brought to recovery room in stable condition, tolerating the procedure   well.   Please note that Physician Assistant, Griffith Citron, PA-C was present for the entirety of the case, and was utilized for pre-operative positioning, peri-operative retractor management, general facilitation of the procedure and for primary wound closure at the end of the case.              Pietro Cassis Alvan Dame, M.D.    08/01/2019 1:55 PM

## 2019-08-01 NOTE — Anesthesia Procedure Notes (Signed)
Anesthesia Regional Block: Adductor canal block   Pre-Anesthetic Checklist: ,, timeout performed, Correct Patient, Correct Site, Correct Laterality, Correct Procedure, Correct Position, site marked, Risks and benefits discussed,  Surgical consent,  Pre-op evaluation,  At surgeon's request and post-op pain management  Laterality: Lower and Left  Prep: chloraprep       Needles:  Injection technique: Single-shot  Needle Type: Echogenic Stimulator Needle     Needle Length: 10cm  Needle Gauge: 21   Needle insertion depth: 3 cm   Additional Needles:   Procedures:,,,, ultrasound used (permanent image in chart),,,,  Narrative:  Start time: 08/01/2019 12:05 PM End time: 08/01/2019 12:13 PM Injection made incrementally with aspirations every 5 mL.  Performed by: Personally  Anesthesiologist: Lyn Hollingshead, MD

## 2019-08-01 NOTE — Anesthesia Postprocedure Evaluation (Signed)
Anesthesia Post Note  Patient: Carol Green  Procedure(s) Performed: TOTAL KNEE ARTHROPLASTY (Left Knee)     Patient location during evaluation: PACU Anesthesia Type: Spinal Level of consciousness: awake Pain management: pain level controlled Vital Signs Assessment: post-procedure vital signs reviewed and stable Respiratory status: spontaneous breathing Cardiovascular status: stable Postop Assessment: no headache, no backache, spinal receding, patient able to bend at knees and no apparent nausea or vomiting Anesthetic complications: no   No complications documented.  Last Vitals:  Vitals:   08/01/19 1445 08/01/19 1500  BP: (!) 102/50 (!) 103/52  Pulse: 62 (!) 59  Resp: 12 12  Temp:    SpO2: 99% 94%    Last Pain:  Vitals:   08/01/19 1445  TempSrc:   PainSc: 0-No pain                 Huston Foley

## 2019-08-01 NOTE — Progress Notes (Signed)
Assisted Dr. Hatchett with left, ultrasound guided, adductor canal block. Side rails up, monitors on throughout procedure. See vital signs in flow sheet. Tolerated Procedure well. 

## 2019-08-01 NOTE — Plan of Care (Signed)
°  Problem: Education: Goal: Knowledge of the prescribed therapeutic regimen will improve Outcome: Progressing Goal: Individualized Educational Video(s) Outcome: Progressing   Problem: Activity: Goal: Ability to avoid complications of mobility impairment will improve Outcome: Progressing Goal: Range of joint motion will improve Outcome: Progressing   Problem: Clinical Measurements: Goal: Postoperative complications will be avoided or minimized Outcome: Progressing

## 2019-08-01 NOTE — Evaluation (Signed)
Physical Therapy Evaluation Patient Details Name: Carol Green MRN: [id number] DOB: 26-Jul-1950 Today's Date: 08/01/2019   History of Present Illness  Patient is 69 y.o. female S/p Lt TKA on 08/01/19 with PMH significant for MS, HTN, OA, alopecia, bil shouler surgery.  Clinical Impression  Carol Green is a 69 y.o. female POD 0 s/p Lt TKA. Patient reports independence with mobility at baseline with intermittent use of AD for gait due to MS related fatigue. Patient is now limited by functional impairments (see PT problem list below) and requires min assist for transfers and gait with RW. Patient was able to ambulate ~18 feet with RW and min assist. Patient instructed in exercise to facilitate circulation. Patient will benefit from continued skilled PT interventions to address impairments and progress towards PLOF. Acute PT will follow to progress mobility and stair training in preparation for safe discharge home.     Follow Up Recommendations Follow surgeon's recommendation for DC plan and follow-up therapies;Home health PT (pt would prefer HHPT (concerned about MS reaction to heat))    Equipment Recommendations  None recommended by PT    Recommendations for Other Services       Precautions / Restrictions Precautions Precautions: Fall Restrictions Weight Bearing Restrictions: No Other Position/Activity Restrictions: WBAT      Mobility  Bed Mobility Overal bed mobility: Needs Assistance Bed Mobility: Supine to Sit     Supine to sit: Min assist;HOB elevated     General bed mobility comments: cues to use bed rail and walk LE's to EOB, light assist to raise trunk.  Transfers Overall transfer level: Needs assistance Equipment used: Rolling walker (2 wheeled) Transfers: Sit to/from Stand Sit to Stand: Min assist;From elevated surface         General transfer comment: pt unable to rise from low bed height, min assist for power up from elevated surface and cues for hand  placement.  Ambulation/Gait Ambulation/Gait assistance: Min assist Gait Distance (Feet): 18 Feet Assistive device: Rolling walker (2 wheeled) Gait Pattern/deviations: Step-to pattern;Decreased stride length;Decreased weight shift to left Gait velocity: decreased   General Gait Details: cues for safe step pattern andproximity to RW, no overt LOB or buckling at Lt knee. assist to manage walker position during turn. distance limited by pain.   Stairs            Wheelchair Mobility    Modified Rankin (Stroke Patients Only)       Balance Overall balance assessment: Needs assistance Sitting-balance support: Feet supported Sitting balance-Leahy Scale: Good     Standing balance support: Bilateral upper extremity supported;During functional activity Standing balance-Leahy Scale: Poor                Pertinent Vitals/Pain Pain Assessment: 0-10 Pain Score: 7  Pain Location: Lt knee Pain Descriptors / Indicators: Aching;Discomfort;Sore Pain Intervention(s): Limited activity within patient's tolerance;Monitored during session;Repositioned;RN gave pain meds during session;Ice applied    Home Living Family/patient expects to be discharged to:: Private residence Living Arrangements: Spouse/significant other Available Help at Discharge: Family Type of Home: House Home Access: Stairs to enter Entrance Stairs-Rails: None Entrance Stairs-Number of Steps: 1+1 small step Home Layout: One level;Laundry or work area in [place]: Environmental consultant - 2 wheels;Cane - single point;Grab bars - tub/shower;Shower seat - built in;Wheelchair - power;Walker - 4 wheels;Cane - quad Additional Comments: bed is up high and she has a step to get onto it.    Prior Function Level of Independence: Independent;Independent with assistive device(s)  Comments: pt uses RW and SPC intermittently for balance if she is having a "bad day" and more fatigued.     Hand Dominance   Dominant  Hand: Right    Extremity/Trunk Assessment   Upper Extremity Assessment Upper Extremity Assessment: Overall WFL for tasks assessed    Lower Extremity Assessment Lower Extremity Assessment: LLE deficits/detail LLE Deficits / Details: good quad activation, no extensor lag with SLR LLE Sensation: WNL LLE Coordination: WNL    Cervical / Trunk Assessment Cervical / Trunk Assessment: Normal  Communication   Communication: No difficulties  Cognition Arousal/Alertness: Awake/alert Behavior During Therapy: WFL for tasks assessed/performed Overall Cognitive Status: Within Functional Limits for tasks assessed                 General Comments      Exercises Total Joint Exercises Ankle Circles/Pumps: AROM;Both;20 reps;Seated   Assessment/Plan    PT Assessment Patient needs continued PT services  PT Problem List Decreased strength;Decreased range of motion;Decreased activity tolerance;Decreased balance;Decreased mobility;Decreased knowledge of use of DME       PT Treatment Interventions DME instruction;Gait training;Stair training;Functional mobility training;Therapeutic activities;Therapeutic exercise;Balance training;Patient/family education    PT Goals (Current goals can be found in the Care Plan section)  Acute Rehab PT Goals Patient Stated Goal: get recovered and avoid MS flare up PT Goal Formulation: With patient Time For Goal Achievement: 08/08/19 Potential to Achieve Goals: Good    Frequency 7X/week   Barriers to discharge        AM-PAC PT "6 Clicks" Mobility  Outcome Measure Help needed turning from your back to your side while in a flat bed without using bedrails?: A Little Help needed moving from lying on your back to sitting on the side of a flat bed without using bedrails?: A Little Help needed moving to and from a bed to a chair (including a wheelchair)?: A Little Help needed standing up from a chair using your arms (e.g., wheelchair or bedside chair)?: A  Little Help needed to walk in hospital room?: A Little Help needed climbing 3-5 steps with a railing? : A Lot 6 Click Score: 17    End of Session Equipment Utilized During Treatment: Gait belt Activity Tolerance: Patient tolerated treatment well Patient left: in chair;with call bell/phone within reach;with chair alarm set;with family/visitor present Nurse Communication: Mobility status PT Visit Diagnosis: Muscle weakness (generalized) (M62.81);Difficulty in walking, not elsewhere classified (R26.2)    Time: 7121-9758 PT Time Calculation (min) (ACUTE ONLY): 35 min   Charges:   PT Evaluation $PT Eval Low Complexity: 1 Low PT Treatments $Gait Training: 8-22 mins        Verner Mould, DPT Acute Rehabilitation Services  Office [phone] Pager [phone]  08/01/2019 7:11 PM

## 2019-08-01 NOTE — Interval H&P Note (Signed)
History and Physical Interval Note:  08/01/2019 11:03 AM  Carol Green  has presented today for surgery, with the diagnosis of Left knee osteoarthritis.  The various methods of treatment have been discussed with the patient and family. After consideration of risks, benefits and other options for treatment, the patient has consented to  Procedure(s) with comments: TOTAL KNEE ARTHROPLASTY (Left) - 70 mins as a surgical intervention.  The patient's history has been reviewed, patient examined, no change in status, stable for surgery.  I have reviewed the patient's chart and labs.  Questions were answered to the patient's satisfaction.     [name]

## 2019-08-02 ENCOUNTER — Encounter (HOSPITAL_COMMUNITY): Payer: Self-pay | Admitting: Orthopedic Surgery

## 2019-08-02 DIAGNOSIS — Z7982 Long term (current) use of aspirin: Secondary | ICD-10-CM | POA: Diagnosis not present

## 2019-08-02 DIAGNOSIS — Z888 Allergy status to other drugs, medicaments and biological substances status: Secondary | ICD-10-CM | POA: Diagnosis not present

## 2019-08-02 DIAGNOSIS — Z885 Allergy status to narcotic agent status: Secondary | ICD-10-CM | POA: Diagnosis not present

## 2019-08-02 DIAGNOSIS — M5030 Other cervical disc degeneration, unspecified cervical region: Secondary | ICD-10-CM | POA: Diagnosis not present

## 2019-08-02 DIAGNOSIS — K219 Gastro-esophageal reflux disease without esophagitis: Secondary | ICD-10-CM | POA: Diagnosis not present

## 2019-08-02 DIAGNOSIS — Z791 Long term (current) use of non-steroidal anti-inflammatories (NSAID): Secondary | ICD-10-CM | POA: Diagnosis not present

## 2019-08-02 DIAGNOSIS — M719 Bursopathy, unspecified: Secondary | ICD-10-CM | POA: Diagnosis not present

## 2019-08-02 DIAGNOSIS — Z6836 Body mass index (BMI) 36.0-36.9, adult: Secondary | ICD-10-CM | POA: Diagnosis not present

## 2019-08-02 DIAGNOSIS — I10 Essential (primary) hypertension: Secondary | ICD-10-CM | POA: Diagnosis not present

## 2019-08-02 DIAGNOSIS — Z79899 Other long term (current) drug therapy: Secondary | ICD-10-CM | POA: Diagnosis not present

## 2019-08-02 DIAGNOSIS — Z9049 Acquired absence of other specified parts of digestive tract: Secondary | ICD-10-CM | POA: Diagnosis not present

## 2019-08-02 DIAGNOSIS — M5136 Other intervertebral disc degeneration, lumbar region: Secondary | ICD-10-CM | POA: Diagnosis not present

## 2019-08-02 DIAGNOSIS — G35 Multiple sclerosis: Secondary | ICD-10-CM | POA: Diagnosis not present

## 2019-08-02 DIAGNOSIS — E782 Mixed hyperlipidemia: Secondary | ICD-10-CM | POA: Diagnosis not present

## 2019-08-02 DIAGNOSIS — E669 Obesity, unspecified: Secondary | ICD-10-CM | POA: Diagnosis present

## 2019-08-02 DIAGNOSIS — Z90710 Acquired absence of both cervix and uterus: Secondary | ICD-10-CM | POA: Diagnosis not present

## 2019-08-02 DIAGNOSIS — M1712 Unilateral primary osteoarthritis, left knee: Secondary | ICD-10-CM | POA: Diagnosis not present

## 2019-08-02 LAB — CBC
HCT: 35.7 % — ABNORMAL LOW (ref 36.0–46.0)
Hemoglobin: 11.5 g/dL — ABNORMAL LOW (ref 12.0–15.0)
MCH: 29.9 pg (ref 26.0–34.0)
MCHC: 32.2 g/dL (ref 30.0–36.0)
MCV: 93 fL (ref 80.0–100.0)
Platelets: 176 10*3/uL (ref 150–400)
RBC: 3.84 MIL/uL — ABNORMAL LOW (ref 3.87–5.11)
RDW: 14.6 % (ref 11.5–15.5)
WBC: 8.7 10*3/uL (ref 4.0–10.5)
nRBC: 0 % (ref 0.0–0.2)

## 2019-08-02 LAB — BASIC METABOLIC PANEL
Anion gap: 9 (ref 5–15)
BUN: 19 mg/dL (ref 8–23)
CO2: 24 mmol/L (ref 22–32)
Calcium: 8.4 mg/dL — ABNORMAL LOW (ref 8.9–10.3)
Chloride: 106 mmol/L (ref 98–111)
Creatinine, Ser: 0.98 mg/dL (ref 0.44–1.00)
GFR calc Af Amer: >60 mL/min (ref >60)
GFR calc non Af Amer: 58 mL/min — ABNORMAL LOW (ref >60)
Glucose, Bld: 206 mg/dL — ABNORMAL HIGH (ref 70–99)
Potassium: 4.1 mmol/L (ref 3.5–5.1)
Sodium: 139 mmol/L (ref 135–145)

## 2019-08-02 MED ORDER — POLYETHYLENE GLYCOL 3350 17 G PO PACK
17.0000 g | PACK | Freq: Two times a day (BID) | ORAL | 0 refills | Status: DC
Start: 2019-08-02 — End: 2021-11-06

## 2019-08-02 MED ORDER — ASPIRIN 81 MG PO CHEW: 81.0000 mg | CHEWABLE_TABLET | Freq: Two times a day (BID) | ORAL | 0 refills | Status: AC

## 2019-08-02 MED ORDER — HYDROMORPHONE HCL 2 MG PO TABS: 2.0000 mg | ORAL_TABLET | Freq: Four times a day (QID) | ORAL | 0 refills | Status: DC | PRN

## 2019-08-02 MED ORDER — METHOCARBAMOL 500 MG PO TABS: 500.0000 mg | ORAL_TABLET | Freq: Four times a day (QID) | ORAL | 0 refills | Status: DC | PRN

## 2019-08-02 MED ORDER — ACETAMINOPHEN 500 MG PO TABS: 1000.0000 mg | ORAL_TABLET | Freq: Three times a day (TID) | ORAL | 0 refills | Status: AC

## 2019-08-02 MED ORDER — DOCUSATE SODIUM 100 MG PO CAPS
100.0000 mg | ORAL_CAPSULE | Freq: Two times a day (BID) | ORAL | 0 refills | Status: DC
Start: 2019-08-02 — End: 2021-11-06

## 2019-08-02 MED ORDER — FERROUS SULFATE 325 (65 FE) MG PO TABS: 325.0000 mg | ORAL_TABLET | Freq: Three times a day (TID) | ORAL | 0 refills | Status: DC

## 2019-08-02 NOTE — Progress Notes (Signed)
Physical Therapy Treatment Patient Details Name: Carol Green MRN: [id number] DOB: 01-17-1951 Today's Date: 08/02/2019    History of Present Illness Patient is 69 y.o. female S/p Lt TKA on 08/01/19 with PMH significant for MS, HTN, OA, alopecia, bil shouler surgery.    PT Comments    POD # 1  Pt OOB and dressed, eager to D/C to home.  Feeling much better.  Assisted with amb in hallway. Then returned to room to perform some TE's following HEP handout.  Instructed on proper tech, freq as well as use of ICE.   Addressed all mobility questions, discussed appropriate activity, educated on use of ICE.  Pt ready for D/C to home.   Follow Up Recommendations  Follow surgeon's recommendation for DC plan and follow-up therapies;Home health PT     Equipment Recommendations  None recommended by PT    Recommendations for Other Services       Precautions / Restrictions Precautions Precautions: Fall Precaution Comments: reviewed no pillow under knee Restrictions Weight Bearing Restrictions: No Other Position/Activity Restrictions: WBAT    Mobility  Bed Mobility               General bed mobility comments: OOB in recliner  Transfers Overall transfer level: Needs assistance Equipment used: Rolling walker (2 wheeled) Transfers: Sit to/from Stand Sit to Stand: Supervision;Min guard         General transfer comment: 25% VC's on proper hand placement and safety with turns.  Pt ststed she normally has a hard time getting up from a seated position because of her MS/B LE weakness  Ambulation/Gait Ambulation/Gait assistance: Supervision;Min guard Gait Distance (Feet): 55 Feet Assistive device: Rolling walker (2 wheeled) Gait Pattern/deviations: Step-to pattern;Decreased stride length;Decreased weight shift to left Gait velocity: decreased   General Gait Details: increased time and 25% VC's on safety with turns.  Pt tolerated amb a functional distance.   Stairs Stairs:  (no  stairs to enter home)           Wheelchair Mobility    Modified Rankin (Stroke Patients Only)       Balance                                            Cognition Arousal/Alertness: Awake/alert Behavior During Therapy: WFL for tasks assessed/performed Overall Cognitive Status: Within Functional Limits for tasks assessed                                 General Comments: AxO x 4 pleasant      Exercises   Total Knee Replacement TE's following HEP handout 10 reps B LE ankle pumps 05 reps towel squeezes 05 reps knee presses 05 reps heel slides  05 reps SAQ's 05 reps SLR's 05 reps ABD Educated on use of gait belt to assist with TE's Followed by ICE     General Comments        Pertinent Vitals/Pain Pain Assessment: 0-10 Pain Score: 3  Pain Location: Lt knee Pain Descriptors / Indicators: Aching;Discomfort;Sore Pain Intervention(s): Monitored during session;Premedicated before session;Repositioned;Ice applied    Home Living                      Prior Function            PT Goals (current  goals can now be found in the care plan section) Progress towards PT goals: Progressing toward goals    Frequency    7X/week      PT Plan Current plan remains appropriate    Co-evaluation              AM-PAC PT "6 Clicks" Mobility   Outcome Measure  Help needed turning from your back to your side while in a flat bed without using bedrails?: None Help needed moving from lying on your back to sitting on the side of a flat bed without using bedrails?: None Help needed moving to and from a bed to a chair (including a wheelchair)?: None Help needed standing up from a chair using your arms (e.g., wheelchair or bedside chair)?: None Help needed to walk in hospital room?: None Help needed climbing 3-5 steps with a railing? : A Little 6 Click Score: 23    End of Session Equipment Utilized During Treatment: Gait  belt Activity Tolerance: Patient tolerated treatment well Patient left: in chair;with call bell/phone within reach;with chair alarm set;with family/visitor present Nurse Communication: Mobility status (pt ready for D/C to home) PT Visit Diagnosis: Muscle weakness (generalized) (M62.81);Difficulty in walking, not elsewhere classified (R26.2)     Time: 1115-1140 PT Time Calculation (min) (ACUTE ONLY): 25 min  Charges:  $Gait Training: 8-22 mins $Therapeutic Exercise: 8-22 mins                     {Abner Ardis  PTA Acute  Rehabilitation Services Pager      [phone] Office      [phone]

## 2019-08-02 NOTE — Progress Notes (Signed)
Patient discharged to home w/ husb. Given all belongings, instructions. Verbalized understanding of all instructions. Escorted to pov via w/c. 

## 2019-08-02 NOTE — Progress Notes (Signed)
     Subjective: 1 Day Post-Op Procedure(s) (LRB): TOTAL KNEE ARTHROPLASTY (Left)   Patient reports pain as mild, pain controlled with medication.  No reported events throughout the night.  Dr. Alvan Dame discussed the procedure, findings and expectations moving forward.  Ready to be discharged home, if they do well with PT.  Follow up in the clinic in 2 weeks.  Knows to call with any questions or concerns.     Patient's anticipated LOS is less than 2 midnights, meeting these requirements: - Lives within 1 hour of care - Has a competent adult at home to recover with post-op recover - NO history of  - Chronic pain requiring opiods  - Diabetes  - Coronary Artery Disease  - Heart failure  - Heart attack  - Stroke  - DVT/VTE  - Cardiac arrhythmia  - Respiratory Failure/COPD  - Renal failure  - Anemia  - Advanced Liver disease    Objective:   VITALS:   Vitals:   08/02/19 0202 08/02/19 0528  BP: (!) 104/47 (!) 116/56  Pulse: 61 (!) 58  Resp: 15 19  Temp: 97.6 F (36.4 C) 97.7 F (36.5 C)  SpO2: 91% 95%    Dorsiflexion/Plantar flexion intact Incision: dressing C/D/I No cellulitis present Compartment soft  LABS Recent Labs    08/02/19 0247  HGB 11.5*  HCT 35.7*  WBC 8.7  PLT 176    Recent Labs    08/02/19 0247  NA 139  K 4.1  BUN 19  CREATININE 0.98  GLUCOSE 206*     Assessment/Plan: 1 Day Post-Op Procedure(s) (LRB): TOTAL KNEE ARTHROPLASTY (Left) Foley cath d/c'ed Advance diet Up with therapy D/C IV fluids Discharge home with home health  Follow up in 2 weeks at [facility] Follow up with OLIN,Emerald Shor D in 2 weeks.  Contact information:  [facility] [address], [address] [place] [id number]    Obese (BMI 30-39.9) Estimated body mass index is 36.44 kg/m as calculated from the following:   Height as of this encounter: 5\' 5"  (1.651 m).   Weight as of this encounter: 99.3 kg. Patient also counseled that weight  may inhibit the healing process Patient counseled that losing weight will help with future health issues       Danae Orleans PA-C  [facility]  [place] [address], [address], [place], [place] [address] Phone: [phone] [email or web address] Facebook  [employer]

## 2019-08-02 NOTE — Discharge Summary (Signed)
Patient ID: Carol Green MRN: [id number] DOB/AGE: 03/12/1950 69 y.o.  Admit date: 08/01/2019 Discharge date: 08/02/2019  Admission Diagnoses:  Principal Problem:   Left knee OA Active Problems:   S/P left TKA   Obese   Discharge Diagnoses:  Same  Past Medical History:  Diagnosis Date  . Acid reflux   . Alopecia   . Arthritis   . Chronic cystitis   . Diverticulosis   . Hypertension   . Multiple sclerosis ([place])   . Varicose veins     Surgeries: Procedure(s):  LEFT TOTAL KNEE ARTHROPLASTY on 08/01/2019   Consultants: N/A  Discharged Condition: Improved  Hospital Course: Carol Green is an 69 y.o. female who was admitted 08/01/2019 for operative treatment ofOsteoarthritis of left knee. Patient has severe unremitting pain that affects sleep, daily activities, and work/hobbies. After pre-op clearance the patient was taken to the operating room on 08/01/2019 and underwent  Procedure(s):  LEFT TOTAL KNEE ARTHROPLASTY.    Patient was given perioperative antibiotics:  Anti-infectives (From admission, onward)   Start     Dose/Rate Route Frequency Ordered Stop   08/01/19 1830  ceFAZolin (ANCEF) IVPB 2g/100 mL premix        2 g 200 mL/hr over 30 Minutes Intravenous Every 6 hours 08/01/19 1601 08/02/19 0058   08/01/19 1015  ceFAZolin (ANCEF) IVPB 2g/100 mL premix        2 g 200 mL/hr over 30 Minutes Intravenous On call to O.R. 08/01/19 1007 08/01/19 1237       Patient was given sequential compression devices, early ambulation, and chemoprophylaxis to prevent DVT.  Patient benefited maximally from hospital stay and there were no complications.    Recent vital signs:  Patient Vitals for the past 24 hrs:  BP Temp Temp src Pulse Resp SpO2 Height Weight  08/02/19 0528 (!) 116/56 97.7 F (36.5 C) -- (!) 58 19 95 % -- --  08/02/19 0202 (!) 104/47 97.6 F (36.4 C) -- 61 15 91 % -- --  08/01/19 1927 117/62 97.7 F (36.5 C) -- (!) 58 16 94 % -- --  08/01/19 1825 121/64 97.8 F  (36.6 C) Oral (!) 59 15 97 % -- --  08/01/19 1717 124/63 (!) 97.5 F (36.4 C) -- (!) 56 15 98 % -- --  08/01/19 1556 (!) 104/51 97.6 F (36.4 C) Oral (!) 59 16 96 % -- --  08/01/19 1530 -- 97.6 F (36.4 C) -- -- 12 -- -- --  08/01/19 1515 (!) 104/52 -- -- 61 12 94 % -- --  08/01/19 1500 (!) 103/52 -- -- (!) 59 12 94 % -- --  08/01/19 1445 (!) 102/50 -- -- 62 12 99 % -- --  08/01/19 1430 (!) 103/51 -- -- 64 12 100 % -- --  08/01/19 1415 (!) 107/50 -- -- 66 10 98 % -- --  08/01/19 1413 (!) 98/49 97.8 F (36.6 C) -- -- 10 100 % -- --  08/01/19 1210 -- -- -- 65 15 96 % -- --  08/01/19 1205 -- -- -- (!) 58 16 100 % -- --  08/01/19 1200 126/62 -- -- (!) 56 14 95 % -- --  08/01/19 1038 -- -- -- -- -- -- 5\' 5"  (1.651 m) 99.3 kg  08/01/19 1025 (!) 122/58 98.7 F (37.1 C) Oral 67 14 95 % -- --     Recent laboratory studies:  Recent Labs    08/02/19 0247  WBC 8.7  HGB 11.5*  HCT  35.7*  PLT 176  NA 139  K 4.1  CL 106  CO2 24  BUN 19  CREATININE 0.98  GLUCOSE 206*  CALCIUM 8.4*     Discharge Medications:   Allergies as of 08/02/2019      Reactions   Codeine Anaphylaxis, Other (See Comments)   Latex    Other reaction(s): Other (See Comments)   Tape Rash   Pt can use paper tape      Medication List    STOP taking these medications   meloxicam 7.5 MG tablet Commonly known as: MOBIC     TAKE these medications   acetaminophen 500 MG tablet Commonly known as: TYLENOL Take 2 tablets (1,000 mg total) by mouth every 8 (eight) hours.   aspirin 81 MG chewable tablet Commonly known as: Aspirin Childrens Chew 1 tablet (81 mg total) by mouth 2 (two) times daily. Take for 4 weeks, then resume regular dose. Start taking on: August 03, 2019 What changed:   how much to take  when to take this  additional instructions   atorvastatin 20 MG tablet Commonly known as: LIPITOR Take 40 mg by mouth at bedtime.   CRANBERRY PO Take 1 tablet by mouth daily at 12 noon.   docusate  sodium 100 MG capsule Commonly known as: Colace Take 1 capsule (100 mg total) by mouth 2 (two) times daily.   ferrous sulfate 325 (65 FE) MG tablet Commonly known as: FerrouSul Take 1 tablet (325 mg total) by mouth 3 (three) times daily with meals for 14 days.   Fingolimod HCl 0.5 MG Caps Take 0.5 mg by mouth daily.   gabapentin 600 MG tablet Commonly known as: NEURONTIN Take 600 mg by mouth in the morning, at noon, in the evening, and at bedtime.   HAIR/SKIN/NAILS/BIOTIN PO Take 1 tablet by mouth daily with supper.   HYDROmorphone 2 MG tablet Commonly known as: Dilaudid Take 1-2 tablets (2-4 mg total) by mouth every 6 (six) hours as needed for severe pain.   lisinopril-hydrochlorothiazide 20-12.5 MG tablet Commonly known as: ZESTORETIC Take 1 tablet by mouth daily.   loratadine 10 MG tablet Commonly known as: CLARITIN Take 10 mg by mouth at bedtime.   methocarbamol 500 MG tablet Commonly known as: Robaxin Take 1 tablet (500 mg total) by mouth every 6 (six) hours as needed for muscle spasms.   multivitamin with minerals Tabs tablet Take 1 tablet by mouth daily at 12 noon.   pantoprazole 40 MG tablet Commonly known as: PROTONIX Take 40 mg by mouth 2 (two) times daily. Before breakfast & before supper   polyethylene glycol 17 g packet Commonly known as: MIRALAX / GLYCOLAX Take 17 g by mouth 2 (two) times daily.            Discharge Care Instructions  (From admission, onward)         Start     Ordered   08/02/19 0000  Change dressing       Comments: Maintain surgical dressing until follow up in the clinic. If the edges start to pull up, may reinforce with tape. If the dressing is no longer working, may remove and cover with gauze and tape, but must keep the area dry and clean.  Call with any questions or concerns.   08/02/19 0903          Diagnostic Studies: No results found.  Disposition: Home  Discharge Instructions    Call MD / Call 911   Complete  by: As  directed    If you experience chest pain or shortness of breath, CALL 911 and be transported to the hospital emergency room.  If you develope a fever above 101 F, pus (white drainage) or increased drainage or redness at the wound, or calf pain, call your surgeon's office.   Change dressing   Complete by: As directed    Maintain surgical dressing until follow up in the clinic. If the edges start to pull up, may reinforce with tape. If the dressing is no longer working, may remove and cover with gauze and tape, but must keep the area dry and clean.  Call with any questions or concerns.   Constipation Prevention   Complete by: As directed    Drink plenty of fluids.  Prune juice may be helpful.  You may use a stool softener, such as Colace (over the counter) 100 mg twice a day.  Use MiraLax (over the counter) for constipation as needed.   Diet - low sodium heart healthy   Complete by: As directed    Discharge instructions   Complete by: As directed    Maintain surgical dressing until follow up in the clinic. If the edges start to pull up, may reinforce with tape. If the dressing is no longer working, may remove and cover with gauze and tape, but must keep the area dry and clean.  Follow up in 2 weeks at [facility]. Call with any questions or concerns.   Increase activity slowly as tolerated   Complete by: As directed    Weight bearing as tolerated with assist device (walker, cane, etc) as directed, use it as long as suggested by your surgeon or therapist, typically at least 4-6 weeks.   TED hose   Complete by: As directed    Use stockings (TED hose) for 2 weeks on both leg(s).  You may remove them at night for sleeping.       Follow-up Information    Paralee Cancel, MD. Schedule an appointment as soon as possible for a visit in 2 weeks.   Specialty: Orthopedic Surgery Contact information: [address] [place] [address] [phone]                 Signed: Lucille Passy [facility] 08/02/2019, 9:03 AM

## 2019-08-02 NOTE — TOC Transition Note (Addendum)
Transition of Care [facility]) - CM/SW Discharge Note   Patient Details  Name: Carol Green MRN: [id number] Date of Birth: 13-Aug-1950  Transition of [facility]) CM/SW Contact:  Lia Hopping, [place] Phone Number: 08/02/2019, 9:46 AM   Clinical Narrative:    CSW met with the patient to discuss home health PT needs. CSW provided the patient with a list of home health agencies and offered choice. Patient chose [facility], unfortunately they cannot provide services until next week. Patient agreeable to a agency in network with her insurance and a good rating. Patient agreeable with [facility] CSW confirm the agency will start services tomorrow.  Patient has DME.  No other needs identified.    Final next level of care: Home w Home Health Services Barriers to Discharge: No Barriers Identified   Patient Goals and CMS Choice Patient states their goals for this hospitalization and ongoing recovery are:: therapy at home for two weeks then transition to outpatient therapy CMS Medicare.gov Compare Post Acute Care list provided to:: Patient Choice offered to / list presented to : Patient  Discharge Placement                       Discharge Plan and Services                DME Arranged: N/A DME Agency: NA       HH Arranged: PT [place] Agency: [place] ([facility]) Date [place]: 08/02/19 Time [place]: [phone] Representative spoke with at [place]: [place] Determinants of Health ([place]) Interventions     Readmission Risk Interventions No flowsheet data found.

## 2019-08-03 DIAGNOSIS — G35 Multiple sclerosis: Secondary | ICD-10-CM | POA: Diagnosis not present

## 2019-08-03 DIAGNOSIS — M5030 Other cervical disc degeneration, unspecified cervical region: Secondary | ICD-10-CM | POA: Diagnosis not present

## 2019-08-03 DIAGNOSIS — K5790 Diverticulosis of intestine, part unspecified, without perforation or abscess without bleeding: Secondary | ICD-10-CM | POA: Diagnosis not present

## 2019-08-03 DIAGNOSIS — Z96652 Presence of left artificial knee joint: Secondary | ICD-10-CM | POA: Diagnosis not present

## 2019-08-03 DIAGNOSIS — I10 Essential (primary) hypertension: Secondary | ICD-10-CM | POA: Diagnosis not present

## 2019-08-03 DIAGNOSIS — M5136 Other intervertebral disc degeneration, lumbar region: Secondary | ICD-10-CM | POA: Diagnosis not present

## 2019-08-03 DIAGNOSIS — G5701 Lesion of sciatic nerve, right lower limb: Secondary | ICD-10-CM | POA: Diagnosis not present

## 2019-08-03 DIAGNOSIS — N3020 Other chronic cystitis without hematuria: Secondary | ICD-10-CM | POA: Diagnosis not present

## 2019-08-03 DIAGNOSIS — Z471 Aftercare following joint replacement surgery: Secondary | ICD-10-CM | POA: Diagnosis not present

## 2019-08-03 DIAGNOSIS — E669 Obesity, unspecified: Secondary | ICD-10-CM | POA: Diagnosis not present

## 2019-08-03 DIAGNOSIS — Z9049 Acquired absence of other specified parts of digestive tract: Secondary | ICD-10-CM | POA: Diagnosis not present

## 2019-08-03 DIAGNOSIS — M7021 Olecranon bursitis, right elbow: Secondary | ICD-10-CM | POA: Diagnosis not present

## 2019-08-03 DIAGNOSIS — Z6834 Body mass index (BMI) 34.0-34.9, adult: Secondary | ICD-10-CM | POA: Diagnosis not present

## 2019-08-03 DIAGNOSIS — K219 Gastro-esophageal reflux disease without esophagitis: Secondary | ICD-10-CM | POA: Diagnosis not present

## 2019-08-03 DIAGNOSIS — Z90710 Acquired absence of both cervix and uterus: Secondary | ICD-10-CM | POA: Diagnosis not present

## 2019-08-03 DIAGNOSIS — I8393 Asymptomatic varicose veins of bilateral lower extremities: Secondary | ICD-10-CM | POA: Diagnosis not present

## 2019-08-14 DIAGNOSIS — Z96652 Presence of left artificial knee joint: Secondary | ICD-10-CM | POA: Diagnosis not present

## 2019-08-14 DIAGNOSIS — Z6833 Body mass index (BMI) 33.0-33.9, adult: Secondary | ICD-10-CM | POA: Diagnosis not present

## 2019-08-31 DIAGNOSIS — M25662 Stiffness of left knee, not elsewhere classified: Secondary | ICD-10-CM | POA: Diagnosis not present

## 2019-08-31 DIAGNOSIS — M25562 Pain in left knee: Secondary | ICD-10-CM | POA: Diagnosis not present

## 2019-08-31 DIAGNOSIS — Z96652 Presence of left artificial knee joint: Secondary | ICD-10-CM | POA: Diagnosis not present

## 2019-08-31 DIAGNOSIS — M6281 Muscle weakness (generalized): Secondary | ICD-10-CM | POA: Diagnosis not present

## 2019-08-31 DIAGNOSIS — R2689 Other abnormalities of gait and mobility: Secondary | ICD-10-CM | POA: Diagnosis not present

## 2019-08-31 DIAGNOSIS — Z471 Aftercare following joint replacement surgery: Secondary | ICD-10-CM | POA: Diagnosis not present

## 2019-09-02 DIAGNOSIS — I10 Essential (primary) hypertension: Secondary | ICD-10-CM | POA: Diagnosis not present

## 2019-09-02 DIAGNOSIS — E782 Mixed hyperlipidemia: Secondary | ICD-10-CM | POA: Diagnosis not present

## 2019-09-02 DIAGNOSIS — K219 Gastro-esophageal reflux disease without esophagitis: Secondary | ICD-10-CM | POA: Diagnosis not present

## 2019-09-04 DIAGNOSIS — M25662 Stiffness of left knee, not elsewhere classified: Secondary | ICD-10-CM | POA: Diagnosis not present

## 2019-09-04 DIAGNOSIS — Z96652 Presence of left artificial knee joint: Secondary | ICD-10-CM | POA: Diagnosis not present

## 2019-09-04 DIAGNOSIS — M25562 Pain in left knee: Secondary | ICD-10-CM | POA: Diagnosis not present

## 2019-09-04 DIAGNOSIS — Z471 Aftercare following joint replacement surgery: Secondary | ICD-10-CM | POA: Diagnosis not present

## 2019-09-04 DIAGNOSIS — M6281 Muscle weakness (generalized): Secondary | ICD-10-CM | POA: Diagnosis not present

## 2019-09-04 DIAGNOSIS — R2689 Other abnormalities of gait and mobility: Secondary | ICD-10-CM | POA: Diagnosis not present

## 2019-09-11 DIAGNOSIS — M25662 Stiffness of left knee, not elsewhere classified: Secondary | ICD-10-CM | POA: Diagnosis not present

## 2019-09-11 DIAGNOSIS — M6281 Muscle weakness (generalized): Secondary | ICD-10-CM | POA: Diagnosis not present

## 2019-09-11 DIAGNOSIS — R2689 Other abnormalities of gait and mobility: Secondary | ICD-10-CM | POA: Diagnosis not present

## 2019-09-11 DIAGNOSIS — M25562 Pain in left knee: Secondary | ICD-10-CM | POA: Diagnosis not present

## 2019-09-11 DIAGNOSIS — Z96652 Presence of left artificial knee joint: Secondary | ICD-10-CM | POA: Diagnosis not present

## 2019-09-11 DIAGNOSIS — Z471 Aftercare following joint replacement surgery: Secondary | ICD-10-CM | POA: Diagnosis not present

## 2019-09-14 DIAGNOSIS — M25562 Pain in left knee: Secondary | ICD-10-CM | POA: Diagnosis not present

## 2019-09-14 DIAGNOSIS — R2689 Other abnormalities of gait and mobility: Secondary | ICD-10-CM | POA: Diagnosis not present

## 2019-09-14 DIAGNOSIS — Z471 Aftercare following joint replacement surgery: Secondary | ICD-10-CM | POA: Diagnosis not present

## 2019-09-14 DIAGNOSIS — M25662 Stiffness of left knee, not elsewhere classified: Secondary | ICD-10-CM | POA: Diagnosis not present

## 2019-09-14 DIAGNOSIS — M6281 Muscle weakness (generalized): Secondary | ICD-10-CM | POA: Diagnosis not present

## 2019-09-14 DIAGNOSIS — Z96652 Presence of left artificial knee joint: Secondary | ICD-10-CM | POA: Diagnosis not present

## 2019-09-21 DIAGNOSIS — R2689 Other abnormalities of gait and mobility: Secondary | ICD-10-CM | POA: Diagnosis not present

## 2019-09-21 DIAGNOSIS — M25662 Stiffness of left knee, not elsewhere classified: Secondary | ICD-10-CM | POA: Diagnosis not present

## 2019-09-21 DIAGNOSIS — M25562 Pain in left knee: Secondary | ICD-10-CM | POA: Diagnosis not present

## 2019-09-21 DIAGNOSIS — M6281 Muscle weakness (generalized): Secondary | ICD-10-CM | POA: Diagnosis not present

## 2019-09-21 DIAGNOSIS — Z471 Aftercare following joint replacement surgery: Secondary | ICD-10-CM | POA: Diagnosis not present

## 2019-09-21 DIAGNOSIS — Z96652 Presence of left artificial knee joint: Secondary | ICD-10-CM | POA: Diagnosis not present

## 2019-09-26 DIAGNOSIS — R2689 Other abnormalities of gait and mobility: Secondary | ICD-10-CM | POA: Diagnosis not present

## 2019-09-26 DIAGNOSIS — M25662 Stiffness of left knee, not elsewhere classified: Secondary | ICD-10-CM | POA: Diagnosis not present

## 2019-09-26 DIAGNOSIS — M25562 Pain in left knee: Secondary | ICD-10-CM | POA: Diagnosis not present

## 2019-09-26 DIAGNOSIS — M6281 Muscle weakness (generalized): Secondary | ICD-10-CM | POA: Diagnosis not present

## 2019-09-26 DIAGNOSIS — Z96652 Presence of left artificial knee joint: Secondary | ICD-10-CM | POA: Diagnosis not present

## 2019-09-26 DIAGNOSIS — Z471 Aftercare following joint replacement surgery: Secondary | ICD-10-CM | POA: Diagnosis not present

## 2019-10-03 DIAGNOSIS — E782 Mixed hyperlipidemia: Secondary | ICD-10-CM | POA: Diagnosis not present

## 2019-10-03 DIAGNOSIS — K219 Gastro-esophageal reflux disease without esophagitis: Secondary | ICD-10-CM | POA: Diagnosis not present

## 2019-10-03 DIAGNOSIS — I10 Essential (primary) hypertension: Secondary | ICD-10-CM | POA: Diagnosis not present

## 2019-10-17 DIAGNOSIS — H40013 Open angle with borderline findings, low risk, bilateral: Secondary | ICD-10-CM | POA: Diagnosis not present

## 2019-11-02 DIAGNOSIS — E782 Mixed hyperlipidemia: Secondary | ICD-10-CM | POA: Diagnosis not present

## 2019-11-02 DIAGNOSIS — I10 Essential (primary) hypertension: Secondary | ICD-10-CM | POA: Diagnosis not present

## 2019-11-02 DIAGNOSIS — K219 Gastro-esophageal reflux disease without esophagitis: Secondary | ICD-10-CM | POA: Diagnosis not present

## 2019-11-08 DIAGNOSIS — Z23 Encounter for immunization: Secondary | ICD-10-CM | POA: Diagnosis not present

## 2019-11-08 DIAGNOSIS — Z96652 Presence of left artificial knee joint: Secondary | ICD-10-CM | POA: Diagnosis not present

## 2019-11-08 DIAGNOSIS — Z6832 Body mass index (BMI) 32.0-32.9, adult: Secondary | ICD-10-CM | POA: Diagnosis not present

## 2019-11-08 DIAGNOSIS — H9201 Otalgia, right ear: Secondary | ICD-10-CM | POA: Diagnosis not present

## 2019-12-02 DIAGNOSIS — K219 Gastro-esophageal reflux disease without esophagitis: Secondary | ICD-10-CM | POA: Diagnosis not present

## 2019-12-02 DIAGNOSIS — I10 Essential (primary) hypertension: Secondary | ICD-10-CM | POA: Diagnosis not present

## 2019-12-02 DIAGNOSIS — E782 Mixed hyperlipidemia: Secondary | ICD-10-CM | POA: Diagnosis not present

## 2019-12-07 DIAGNOSIS — Z1231 Encounter for screening mammogram for malignant neoplasm of breast: Secondary | ICD-10-CM | POA: Diagnosis not present

## 2019-12-07 DIAGNOSIS — M8588 Other specified disorders of bone density and structure, other site: Secondary | ICD-10-CM | POA: Diagnosis not present

## 2019-12-07 DIAGNOSIS — Z01419 Encounter for gynecological examination (general) (routine) without abnormal findings: Secondary | ICD-10-CM | POA: Diagnosis not present

## 2019-12-07 DIAGNOSIS — Z6834 Body mass index (BMI) 34.0-34.9, adult: Secondary | ICD-10-CM | POA: Diagnosis not present

## 2020-01-02 DIAGNOSIS — E782 Mixed hyperlipidemia: Secondary | ICD-10-CM | POA: Diagnosis not present

## 2020-01-02 DIAGNOSIS — I10 Essential (primary) hypertension: Secondary | ICD-10-CM | POA: Diagnosis not present

## 2020-01-02 DIAGNOSIS — K219 Gastro-esophageal reflux disease without esophagitis: Secondary | ICD-10-CM | POA: Diagnosis not present

## 2020-01-13 IMAGING — RF DG ESOPHAGUS
11 series · 14 of 24 positions shown · non-contrast
Comparison: None.

CLINICAL DATA: Dysphagia

EXAM:
ESOPHOGRAM / BARIUM SWALLOW / BARIUM TABLET STUDY
TECHNIQUE: Combined double contrast and single contrast examination performed
using effervescent crystals, thick barium liquid, and thin barium
liquid. The patient was observed with fluoroscopy swallowing a 13 mm
barium sulphate tablet.
FLUOROSCOPY TIME:  Fluoroscopy Time:  2 minutes and 36 seconds.
Radiation Exposure Index (if provided by the fluoroscopic device):
194 mGy
Number of Acquired Spot Images:

[Series 1: sequence · 0.28mm/px · 2 of 17 frames shown (1 of 10)]
[frame 3/17]
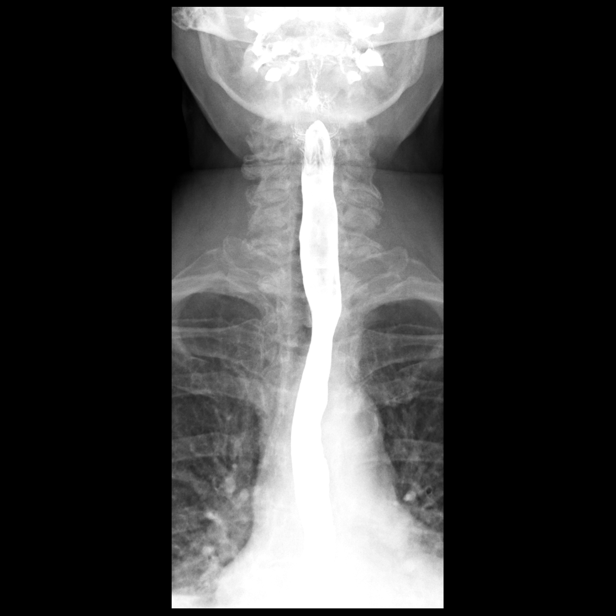
[frame 15/17]
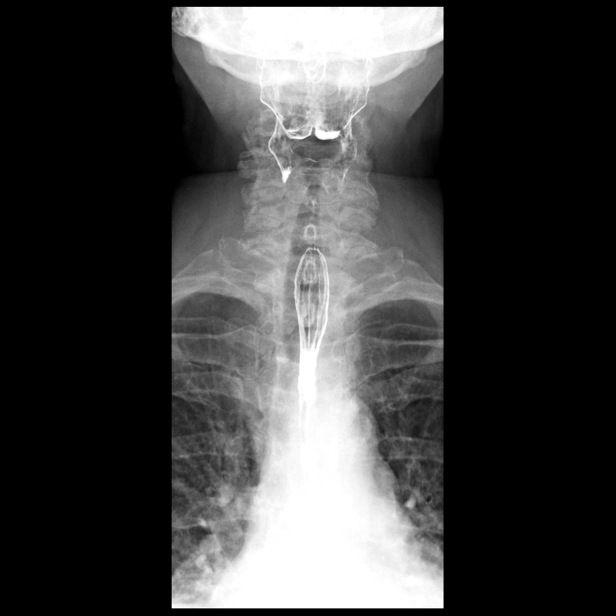

[Series 2: sequence · 0.28mm/px · 1 of 10 frames shown (2 of 10)]
[frame 10/10]
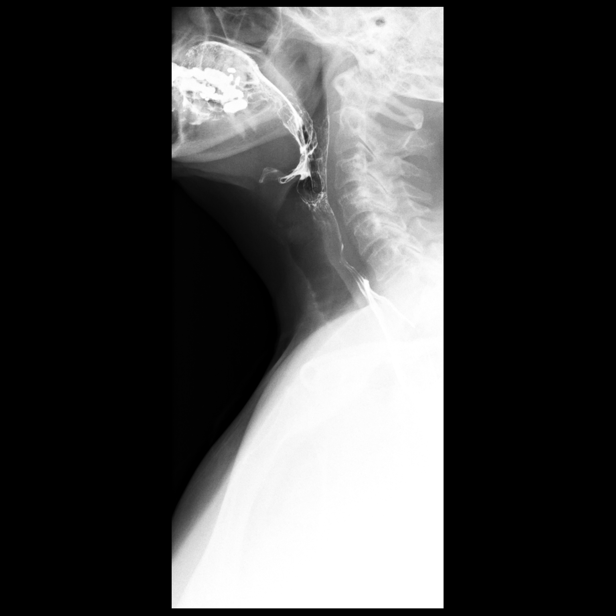

[Series 3: sequence · 0.28mm/px · 1 of 40 frames shown (3 of 10)]
[frame 35/40]
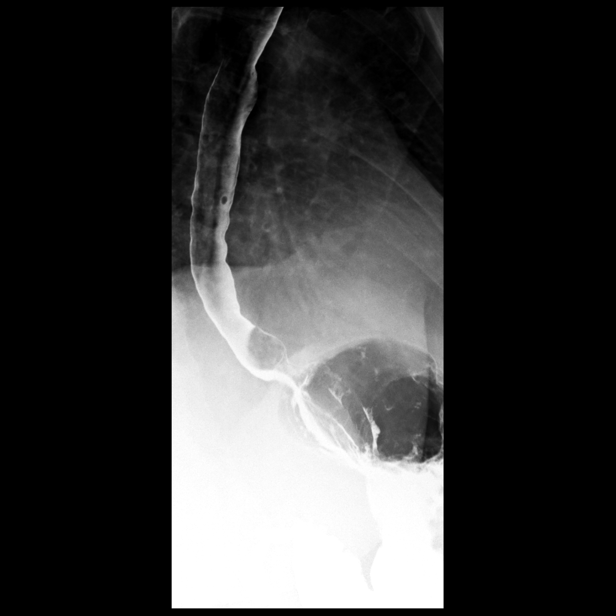

[Series 4: sequence · 1 of 51 frames shown (4 of 10)]
[frame 8/51]
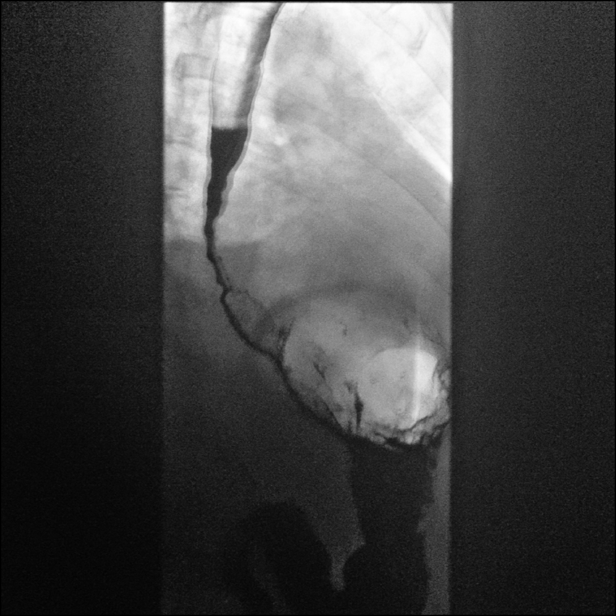

[Series 5: sequence · 1 of 30 frames shown (5 of 10)]
[frame 4/30]
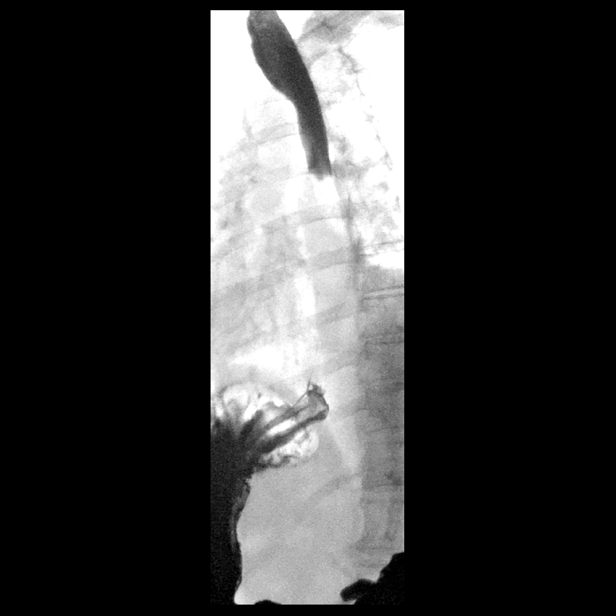

[Series 6: sequence · 2 of 29 frames shown (6 of 10)]
[frame 5/29]
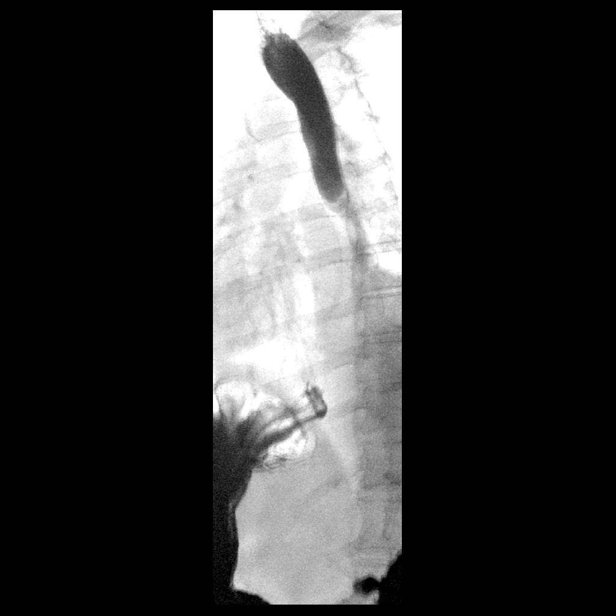
[frame 13/29]
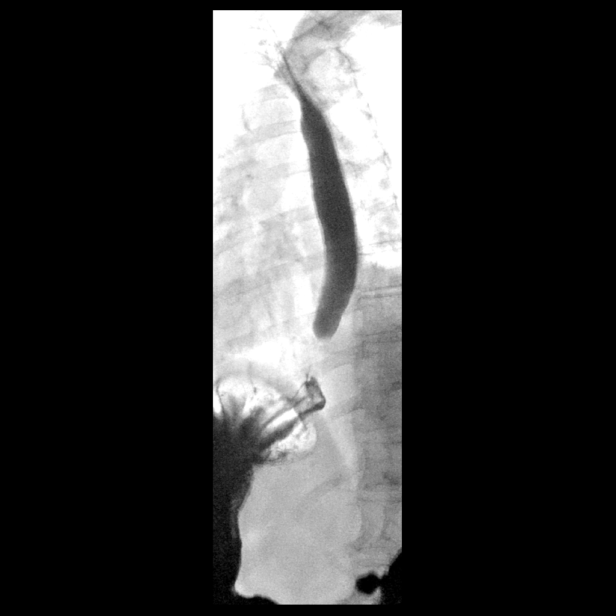

[Series 7: sequence · 1 of 36 frames shown (7 of 10)]
[frame 6/36]
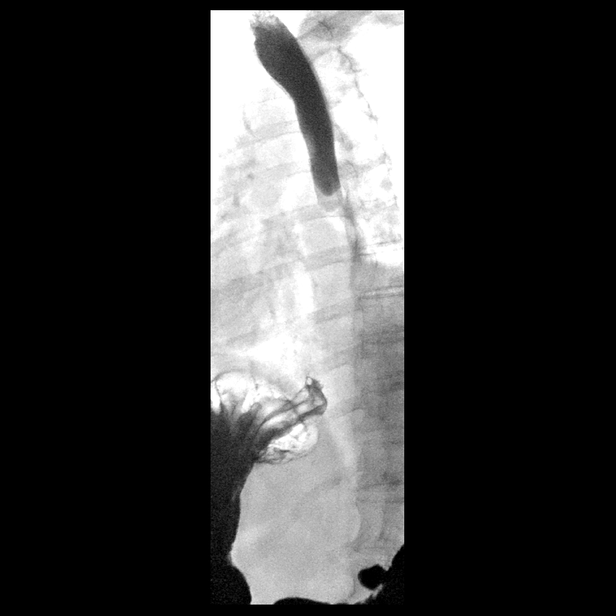

[Series 8: sequence · 1 of 36 frames shown (8 of 10)]
[frame 1/36]
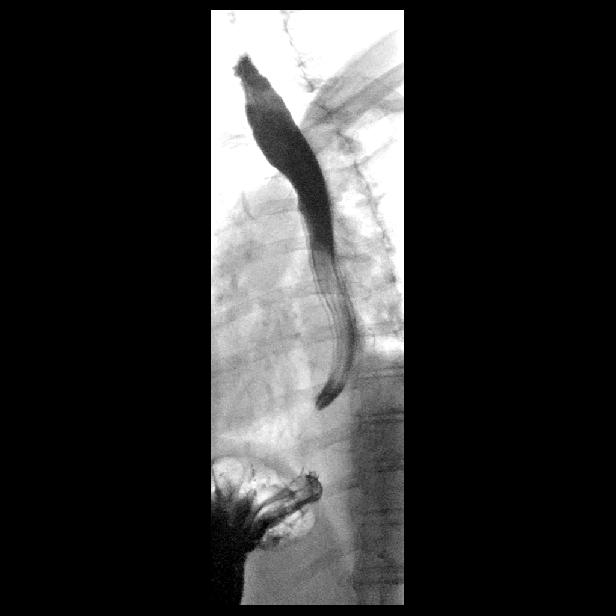

[Series 9: sequence · 2 of 28 frames shown (9 of 10)]
[frame 5/28]
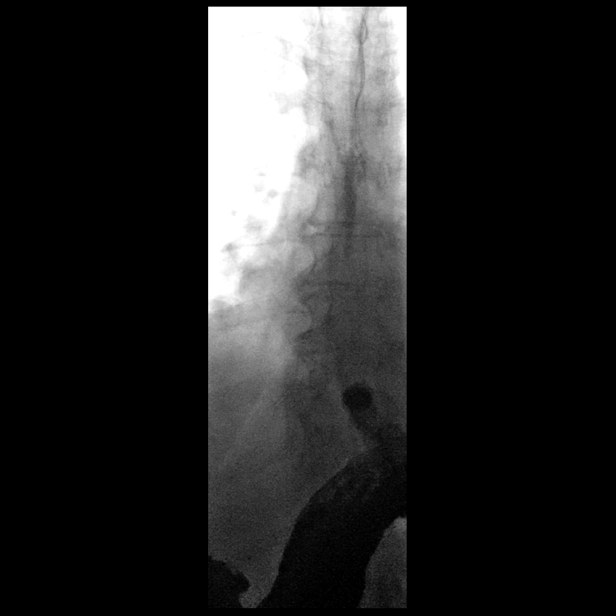
[frame 8/28]
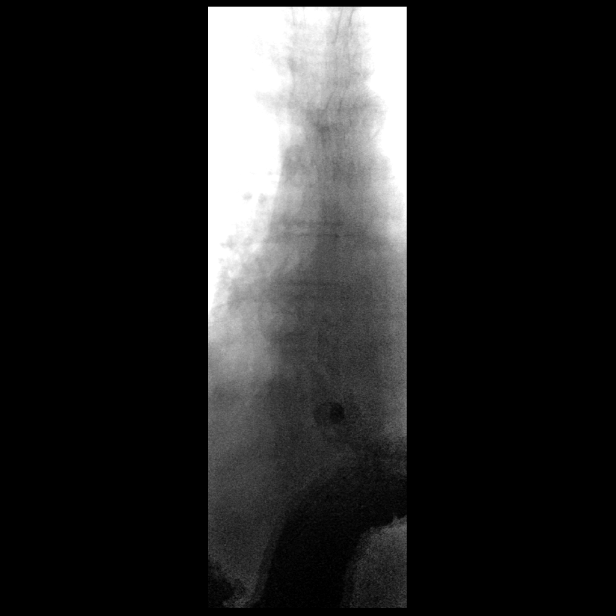

[Series 10: sequence · 1 of 60 frames shown (10 of 10)]
[frame 10/60]
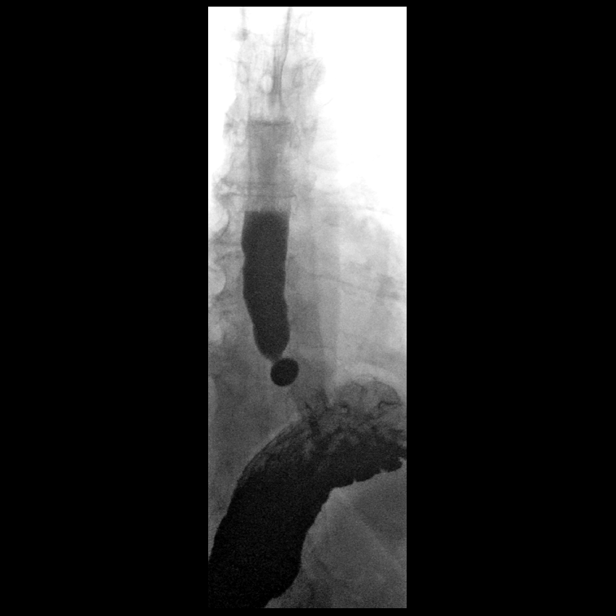

[Series 11: one shot · 1 of 1 slices shown]
[im 1/1]
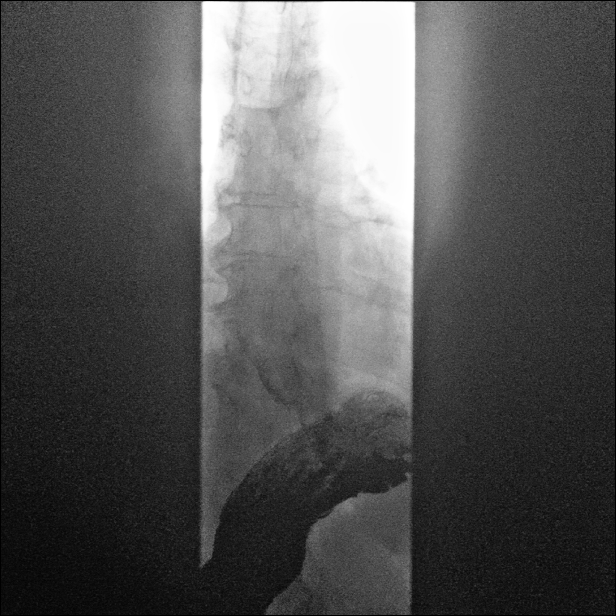

[14 of 24 positions shown; findings below may reference images not displayed]

FINDINGS: Frontal and lateral views of the hypopharynx while swallowing are
normal.

Double contrast imaging of the esophagus shows no evidence for mass
lesion diverticulum, or gross mucosal ulceration. Gradual tapering
noted distal esophagus.

Small sliding type hiatal hernia noted with Schatzki's ring evident.
Evaluation of motility reveals episodes of proximal escape although
primary peristaltic stripping wave is largely preserved. Tertiary
contractions are noted in the distal esophagus.

13 mm barium tablet lodges transiently at the level of the
esophagogastric junction but passes into the stomach with a repeated
swallow of water.
IMPRESSION: 1. Small sliding type hiatal hernia with Schatzki's ring.
2. Gradual, smooth tapering of the distal esophagus, nonobstructive
to a 13 mm barium tablet .
3. Tertiary contractions in the distal esophagus.

## 2020-01-18 DIAGNOSIS — Z809 Family history of malignant neoplasm, unspecified: Secondary | ICD-10-CM | POA: Diagnosis not present

## 2020-01-18 DIAGNOSIS — Z9189 Other specified personal risk factors, not elsewhere classified: Secondary | ICD-10-CM | POA: Diagnosis not present

## 2020-02-01 DIAGNOSIS — I10 Essential (primary) hypertension: Secondary | ICD-10-CM | POA: Diagnosis not present

## 2020-02-01 DIAGNOSIS — E782 Mixed hyperlipidemia: Secondary | ICD-10-CM | POA: Diagnosis not present

## 2020-02-01 DIAGNOSIS — K219 Gastro-esophageal reflux disease without esophagitis: Secondary | ICD-10-CM | POA: Diagnosis not present

## 2020-02-29 DIAGNOSIS — T8484XA Pain due to internal orthopedic prosthetic devices, implants and grafts, initial encounter: Secondary | ICD-10-CM | POA: Diagnosis not present

## 2020-04-01 DIAGNOSIS — I10 Essential (primary) hypertension: Secondary | ICD-10-CM | POA: Diagnosis not present

## 2020-04-01 DIAGNOSIS — K219 Gastro-esophageal reflux disease without esophagitis: Secondary | ICD-10-CM | POA: Diagnosis not present

## 2020-04-01 DIAGNOSIS — E782 Mixed hyperlipidemia: Secondary | ICD-10-CM | POA: Diagnosis not present

## 2020-04-08 DIAGNOSIS — Z6833 Body mass index (BMI) 33.0-33.9, adult: Secondary | ICD-10-CM | POA: Diagnosis not present

## 2020-04-08 DIAGNOSIS — B351 Tinea unguium: Secondary | ICD-10-CM | POA: Diagnosis not present

## 2020-04-08 DIAGNOSIS — M1990 Unspecified osteoarthritis, unspecified site: Secondary | ICD-10-CM | POA: Diagnosis not present

## 2020-04-08 DIAGNOSIS — I10 Essential (primary) hypertension: Secondary | ICD-10-CM | POA: Diagnosis not present

## 2020-04-15 DIAGNOSIS — B351 Tinea unguium: Secondary | ICD-10-CM | POA: Diagnosis not present

## 2020-05-09 DIAGNOSIS — K219 Gastro-esophageal reflux disease without esophagitis: Secondary | ICD-10-CM | POA: Diagnosis not present

## 2020-05-09 DIAGNOSIS — Z86010 Personal history of colonic polyps: Secondary | ICD-10-CM | POA: Diagnosis not present

## 2020-05-09 DIAGNOSIS — R1313 Dysphagia, pharyngeal phase: Secondary | ICD-10-CM | POA: Diagnosis not present

## 2020-06-02 DIAGNOSIS — E860 Dehydration: Secondary | ICD-10-CM | POA: Diagnosis not present

## 2020-06-02 DIAGNOSIS — R531 Weakness: Secondary | ICD-10-CM | POA: Diagnosis not present

## 2020-06-02 DIAGNOSIS — G35 Multiple sclerosis: Secondary | ICD-10-CM | POA: Diagnosis not present

## 2020-06-02 DIAGNOSIS — Z20822 Contact with and (suspected) exposure to covid-19: Secondary | ICD-10-CM | POA: Diagnosis not present

## 2020-06-02 DIAGNOSIS — R112 Nausea with vomiting, unspecified: Secondary | ICD-10-CM | POA: Diagnosis not present

## 2020-06-05 DIAGNOSIS — Z1331 Encounter for screening for depression: Secondary | ICD-10-CM | POA: Diagnosis not present

## 2020-06-05 DIAGNOSIS — B351 Tinea unguium: Secondary | ICD-10-CM | POA: Diagnosis not present

## 2020-06-05 DIAGNOSIS — Z6831 Body mass index (BMI) 31.0-31.9, adult: Secondary | ICD-10-CM | POA: Diagnosis not present

## 2020-06-05 DIAGNOSIS — G35 Multiple sclerosis: Secondary | ICD-10-CM | POA: Diagnosis not present

## 2020-07-25 DIAGNOSIS — M25562 Pain in left knee: Secondary | ICD-10-CM | POA: Diagnosis not present

## 2020-08-01 DIAGNOSIS — K219 Gastro-esophageal reflux disease without esophagitis: Secondary | ICD-10-CM | POA: Diagnosis not present

## 2020-08-01 DIAGNOSIS — I10 Essential (primary) hypertension: Secondary | ICD-10-CM | POA: Diagnosis not present

## 2020-08-01 DIAGNOSIS — E782 Mixed hyperlipidemia: Secondary | ICD-10-CM | POA: Diagnosis not present

## 2020-08-08 DIAGNOSIS — G4700 Insomnia, unspecified: Secondary | ICD-10-CM | POA: Diagnosis not present

## 2020-08-08 DIAGNOSIS — G35 Multiple sclerosis: Secondary | ICD-10-CM | POA: Diagnosis not present

## 2020-08-08 DIAGNOSIS — D72819 Decreased white blood cell count, unspecified: Secondary | ICD-10-CM | POA: Diagnosis not present

## 2020-08-15 DIAGNOSIS — G35 Multiple sclerosis: Secondary | ICD-10-CM | POA: Diagnosis not present

## 2020-08-15 DIAGNOSIS — Z0189 Encounter for other specified special examinations: Secondary | ICD-10-CM | POA: Diagnosis not present

## 2020-08-15 DIAGNOSIS — Z7721 Contact with and (suspected) exposure to potentially hazardous body fluids: Secondary | ICD-10-CM | POA: Diagnosis not present

## 2020-08-15 DIAGNOSIS — Z111 Encounter for screening for respiratory tuberculosis: Secondary | ICD-10-CM | POA: Diagnosis not present

## 2020-11-11 DIAGNOSIS — Z6831 Body mass index (BMI) 31.0-31.9, adult: Secondary | ICD-10-CM | POA: Diagnosis not present

## 2020-11-11 DIAGNOSIS — R159 Full incontinence of feces: Secondary | ICD-10-CM | POA: Diagnosis not present

## 2020-11-11 DIAGNOSIS — L304 Erythema intertrigo: Secondary | ICD-10-CM | POA: Diagnosis not present

## 2020-11-25 DIAGNOSIS — Z23 Encounter for immunization: Secondary | ICD-10-CM | POA: Diagnosis not present

## 2020-12-04 DIAGNOSIS — H40013 Open angle with borderline findings, low risk, bilateral: Secondary | ICD-10-CM | POA: Diagnosis not present

## 2020-12-11 DIAGNOSIS — Z01419 Encounter for gynecological examination (general) (routine) without abnormal findings: Secondary | ICD-10-CM | POA: Diagnosis not present

## 2020-12-11 DIAGNOSIS — Z6834 Body mass index (BMI) 34.0-34.9, adult: Secondary | ICD-10-CM | POA: Diagnosis not present

## 2020-12-11 DIAGNOSIS — Z1231 Encounter for screening mammogram for malignant neoplasm of breast: Secondary | ICD-10-CM | POA: Diagnosis not present

## 2021-01-20 DIAGNOSIS — Z23 Encounter for immunization: Secondary | ICD-10-CM | POA: Diagnosis not present

## 2021-02-18 DIAGNOSIS — G35 Multiple sclerosis: Secondary | ICD-10-CM | POA: Diagnosis not present

## 2021-02-18 DIAGNOSIS — B029 Zoster without complications: Secondary | ICD-10-CM | POA: Diagnosis not present

## 2021-02-18 DIAGNOSIS — G4700 Insomnia, unspecified: Secondary | ICD-10-CM | POA: Diagnosis not present

## 2021-02-18 DIAGNOSIS — D72819 Decreased white blood cell count, unspecified: Secondary | ICD-10-CM | POA: Diagnosis not present

## 2021-04-09 ENCOUNTER — Non-Acute Institutional Stay (HOSPITAL_COMMUNITY)
Admission: RE | Admit: 2021-04-09 | Discharge: 2021-04-09 | Disposition: A | Discharge status: Home / Self Care | Payer: Medicare Other | Source: Ambulatory Visit | Attending: Internal Medicine | Admitting: Internal Medicine

## 2021-04-09 ENCOUNTER — Other Ambulatory Visit: Payer: Self-pay

## 2021-04-09 DIAGNOSIS — D72819 Decreased white blood cell count, unspecified: Secondary | ICD-10-CM | POA: Diagnosis not present

## 2021-04-09 DIAGNOSIS — G35 Multiple sclerosis: Secondary | ICD-10-CM | POA: Insufficient documentation

## 2021-04-09 LAB — HEPATITIS B CORE ANTIBODY, TOTAL: Hep B Core Total Ab: NONREACTIVE

## 2021-04-09 LAB — COMPREHENSIVE METABOLIC PANEL
ALT: 17 U/L (ref 0–44)
AST: 21 U/L (ref 15–41)
Albumin: 4 g/dL (ref 3.5–5.0)
Alkaline Phosphatase: 91 U/L (ref 38–126)
Anion gap: 9 (ref 5–15)
BUN: 30 mg/dL — ABNORMAL HIGH (ref 8–23)
CO2: 26 mmol/L (ref 22–32)
Calcium: 9.2 mg/dL (ref 8.9–10.3)
Chloride: 105 mmol/L (ref 98–111)
Creatinine, Ser: 1.06 mg/dL — ABNORMAL HIGH (ref 0.44–1.00)
GFR, Estimated: 56 mL/min — ABNORMAL LOW (ref >60)
Glucose, Bld: 126 mg/dL — ABNORMAL HIGH (ref 70–99)
Potassium: 3.9 mmol/L (ref 3.5–5.1)
Sodium: 140 mmol/L (ref 135–145)
Total Bilirubin: 0.3 mg/dL (ref 0.3–1.2)
Total Protein: 7 g/dL (ref 6.5–8.1)

## 2021-04-09 LAB — HEPATITIS B SURFACE ANTIGEN: Hepatitis B Surface Ag: NONREACTIVE

## 2021-04-09 MED ORDER — ACETAMINOPHEN 500 MG PO TABS
1000.0000 mg | ORAL_TABLET | Freq: Once | ORAL | Status: AC
  Administered 2021-04-09: 1000 mg via ORAL
  Filled 2021-04-09: qty 2

## 2021-04-09 MED ORDER — DIPHENHYDRAMINE HCL 50 MG/ML IJ SOLN
25.0000 mg | Freq: Once | INTRAMUSCULAR | Status: AC
  Administered 2021-04-09: 25 mg via INTRAVENOUS
  Filled 2021-04-09: qty 1

## 2021-04-09 MED ORDER — SODIUM CHLORIDE 0.9 % IV SOLN: INTRAVENOUS | Status: DC | PRN

## 2021-04-09 MED ORDER — SODIUM CHLORIDE 0.9 % IV SOLN
300.0000 mg | Freq: Once | INTRAVENOUS | Status: AC
  Administered 2021-04-09: 300 mg via INTRAVENOUS
  Filled 2021-04-09: qty 10

## 2021-04-09 MED ORDER — METHYLPREDNISOLONE SODIUM SUCC 125 MG IJ SOLR
125.0000 mg | Freq: Once | INTRAMUSCULAR | Status: AC
  Administered 2021-04-09: 125 mg via INTRAVENOUS
  Filled 2021-04-09: qty 2

## 2021-04-09 NOTE — Progress Notes (Signed)
PATIENT CARE CENTER NOTE: ? ?Diagnosis: Multiple Sclerosis  ? ? ?Provider:  Delphia Grates MD ? ? ?Procedure: Ocrevus '300mg'$  + labs ( immunoglobulins, Hep B antigen/core, Quantiferon, CMP, lymphocytes) ? ? ?Patient received IV Ocrevus ( day 1).  Pre infusion medications - Tylenol PO, IV Benadryl, and IV Solu-medrol were given. Pt had a pre infusion BP 104/54, and pt states her lips feel numb after premedications given ( Ocrevus not yet infusing). Donnelly Angelica PA ,covering in clinic for Dr. Dellis Filbert, was notified. Instructions given to administer  500cc NS fluid bolus if SBP reads below 90  during infusion. Instructed to proceed with Ocrevus infusion. Numb sensation could possibly be from trigeminal neuralgia per provider and to notify if symptoms worsen. Medication was titrated per protocol. BP 122/61  post infusion. Patient observed for 1 hour post infusion. Tolerated well, denies any complaints and vitals stable. Labs collected by peripheral labdraw after infusion. Spoke with Tanja Port , lab tech at Dr. Bonnee Quin clinic, who confirmed labs can be drawn after infusion is completed. Discharge instructions given, verbalized understanding. Pt instructed to scheduled next infusion in 15 days and will resume infusions Q6 months subsequently, verbalized understanding. Patient alert, oriented and ambulatory at the time of discharge accompanied by husband.  ?

## 2021-04-10 LAB — IGG, IGA, IGM
IgA: 45 mg/dL — ABNORMAL LOW (ref 64–422)
IgG (Immunoglobin G), Serum: 513 mg/dL — ABNORMAL LOW (ref 586–1602)
IgM (Immunoglobulin M), Srm: 190 mg/dL (ref 26–217)

## 2021-04-10 LAB — CD4/CD8 (T-HELPER/T-SUPPRESSOR CELL)
CD4 absolute: 84 /uL — ABNORMAL LOW (ref 400–1790)
CD4%: 42.47 % (ref 33–65)
CD8 T Cell Abs: <45 /uL — ABNORMAL LOW (ref 190–1000)
CD8tox: 8.37 % — ABNORMAL LOW (ref 12–40)
Ratio: 5.08 — ABNORMAL HIGH (ref 1.0–3.0)
Total lymphocyte count: 199 /uL — ABNORMAL LOW (ref 1000–4000)

## 2021-04-11 LAB — QUANTIFERON-TB GOLD PLUS (RQFGPL)
QuantiFERON Mitogen Value: 0.18 IU/mL
QuantiFERON Nil Value: 0.11 IU/mL
QuantiFERON TB1 Ag Value: 0.12 IU/mL
QuantiFERON TB2 Ag Value: 0.12 IU/mL

## 2021-04-11 LAB — QUANTIFERON-TB GOLD PLUS: QuantiFERON-TB Gold Plus: UNDETERMINED — AB

## 2021-04-12 LAB — IGE: IgE (Immunoglobulin E), Serum: 41 IU/mL (ref 6–495)

## 2021-04-24 ENCOUNTER — Non-Acute Institutional Stay (HOSPITAL_COMMUNITY)
Admission: RE | Admit: 2021-04-24 | Discharge: 2021-04-24 | Disposition: A | Discharge status: Home / Self Care | Payer: Medicare Other | Source: Ambulatory Visit | Attending: Internal Medicine | Admitting: Internal Medicine

## 2021-04-24 ENCOUNTER — Other Ambulatory Visit: Payer: Self-pay

## 2021-04-24 DIAGNOSIS — G35 Multiple sclerosis: Secondary | ICD-10-CM | POA: Diagnosis not present

## 2021-04-24 MED ORDER — ACETAMINOPHEN 500 MG PO TABS
1000.0000 mg | ORAL_TABLET | Freq: Once | ORAL | Status: AC
  Administered 2021-04-24: 1000 mg via ORAL
  Filled 2021-04-24: qty 2

## 2021-04-24 MED ORDER — SODIUM CHLORIDE 0.9 % IV SOLN
300.0000 mg | Freq: Once | INTRAVENOUS | Status: AC
  Administered 2021-04-24: 300 mg via INTRAVENOUS
  Filled 2021-04-24: qty 10

## 2021-04-24 MED ORDER — SODIUM CHLORIDE 0.9 % IV SOLN: INTRAVENOUS | Status: DC | PRN

## 2021-04-24 MED ORDER — METHYLPREDNISOLONE SODIUM SUCC 125 MG IJ SOLR
125.0000 mg | Freq: Once | INTRAMUSCULAR | Status: AC
Start: 2021-04-24 — End: 2021-04-24
  Administered 2021-04-24: 125 mg via INTRAVENOUS
  Filled 2021-04-24: qty 2

## 2021-04-24 MED ORDER — DIPHENHYDRAMINE HCL 50 MG/ML IJ SOLN
25.0000 mg | Freq: Once | INTRAMUSCULAR | Status: AC
  Administered 2021-04-24: 25 mg via INTRAVENOUS
  Filled 2021-04-24: qty 1

## 2021-04-24 NOTE — Progress Notes (Signed)
PATIENT CARE CENTER NOTE ? ? ?Diagnosis: G35 Multiple Sclerosis  ? ? ?Provider: Effie Shy, MD ? ? ?Procedure: Ocrevus 300 mg infusion  ? ? ?Note:  Patient received second loading dose (day 15) of Ocrevus. Pre-medications given (Tylenol, Benadryl, Solumedrol) per order. Patient tolerated infusion well with no adverse reaction. Observed patient for 1 hour post infusion. Vital signs stable. Discharge instructions given. Patient is to come every 6 months for Ocrevus infusions and will schedule next appointment at front desk. Patient alert, oriented and ambulatory at discharge. Discharged home with husband.   ?

## 2021-05-21 DIAGNOSIS — I10 Essential (primary) hypertension: Secondary | ICD-10-CM | POA: Diagnosis not present

## 2021-05-21 DIAGNOSIS — E099 Drug or chemical induced diabetes mellitus without complications: Secondary | ICD-10-CM | POA: Diagnosis not present

## 2021-05-21 DIAGNOSIS — M1990 Unspecified osteoarthritis, unspecified site: Secondary | ICD-10-CM | POA: Diagnosis not present

## 2021-05-21 DIAGNOSIS — E782 Mixed hyperlipidemia: Secondary | ICD-10-CM | POA: Diagnosis not present

## 2021-07-29 DIAGNOSIS — Z1331 Encounter for screening for depression: Secondary | ICD-10-CM | POA: Diagnosis not present

## 2021-07-29 DIAGNOSIS — G35 Multiple sclerosis: Secondary | ICD-10-CM | POA: Diagnosis not present

## 2021-07-29 DIAGNOSIS — Z0000 Encounter for general adult medical examination without abnormal findings: Secondary | ICD-10-CM | POA: Diagnosis not present

## 2021-07-29 DIAGNOSIS — R32 Unspecified urinary incontinence: Secondary | ICD-10-CM | POA: Diagnosis not present

## 2021-10-20 DIAGNOSIS — G35 Multiple sclerosis: Secondary | ICD-10-CM | POA: Diagnosis not present

## 2021-10-20 DIAGNOSIS — D72819 Decreased white blood cell count, unspecified: Secondary | ICD-10-CM | POA: Diagnosis not present

## 2021-10-21 DIAGNOSIS — R32 Unspecified urinary incontinence: Secondary | ICD-10-CM | POA: Diagnosis not present

## 2021-10-21 DIAGNOSIS — G35 Multiple sclerosis: Secondary | ICD-10-CM | POA: Diagnosis not present

## 2021-10-21 DIAGNOSIS — Z6834 Body mass index (BMI) 34.0-34.9, adult: Secondary | ICD-10-CM | POA: Diagnosis not present

## 2021-10-24 ENCOUNTER — Non-Acute Institutional Stay (HOSPITAL_COMMUNITY)
Admission: RE | Admit: 2021-10-24 | Discharge: 2021-10-24 | Disposition: A | Discharge status: Home / Self Care | Payer: Medicare Other | Source: Ambulatory Visit | Attending: Internal Medicine | Admitting: Internal Medicine

## 2021-10-24 DIAGNOSIS — G35 Multiple sclerosis: Secondary | ICD-10-CM | POA: Diagnosis not present

## 2021-10-24 MED ORDER — SODIUM CHLORIDE 0.9 % IV SOLN
600.0000 mg | Freq: Once | INTRAVENOUS | Status: AC
  Administered 2021-10-24: 600 mg via INTRAVENOUS
  Filled 2021-10-24: qty 20

## 2021-10-24 MED ORDER — DIPHENHYDRAMINE HCL 50 MG/ML IJ SOLN: 25.0000 mg | Freq: Once | INTRAMUSCULAR | Status: DC | PRN

## 2021-10-24 MED ORDER — SODIUM CHLORIDE 0.9 % IV SOLN: INTRAVENOUS | Status: DC | PRN

## 2021-10-24 MED ORDER — DIPHENHYDRAMINE HCL 50 MG/ML IJ SOLN
25.0000 mg | Freq: Once | INTRAMUSCULAR | Status: AC
  Administered 2021-10-24: 25 mg via INTRAVENOUS
  Filled 2021-10-24: qty 1

## 2021-10-24 MED ORDER — ACETAMINOPHEN 500 MG PO TABS
1000.0000 mg | ORAL_TABLET | Freq: Once | ORAL | Status: AC
  Administered 2021-10-24: 1000 mg via ORAL
  Filled 2021-10-24: qty 2

## 2021-10-24 MED ORDER — METHYLPREDNISOLONE SODIUM SUCC 125 MG IJ SOLR
125.0000 mg | Freq: Once | INTRAMUSCULAR | Status: AC
  Administered 2021-10-24: 125 mg via INTRAVENOUS
  Filled 2021-10-24: qty 2

## 2021-10-24 NOTE — Progress Notes (Signed)
PATIENT CARE CENTER NOTE:  Diagnosis: Multiple Sclerosis G35  Provider: Etta Grandchild NP  Procedure: De Nurse '600mg'$  infusion    Patient received IV Ocrevus. Pre infusion medications - Tylenol PO, Benadryl IV, IV Solu-medrol were given. Medication was titrated per protocol, no adverse reaction noted. Patient declined to wait for the entire 60 minutes post infusion observation. Tolerated well, vitals stable, discharge instructions given, verbalized understanding. Pt to received infusion Q 6 months, reviewed with pt and instructed pt to schedule next infusion, verbalized understanding. Patient alert, oriented and ambulatory at the time of discharge, accompanied by husband.

## 2021-11-03 DIAGNOSIS — J069 Acute upper respiratory infection, unspecified: Secondary | ICD-10-CM | POA: Diagnosis not present

## 2021-11-04 ENCOUNTER — Encounter (HOSPITAL_COMMUNITY): Payer: Self-pay

## 2021-11-04 DIAGNOSIS — R9082 White matter disease, unspecified: Secondary | ICD-10-CM | POA: Diagnosis not present

## 2021-11-04 DIAGNOSIS — R531 Weakness: Secondary | ICD-10-CM | POA: Diagnosis not present

## 2021-11-04 DIAGNOSIS — G919 Hydrocephalus, unspecified: Secondary | ICD-10-CM | POA: Diagnosis not present

## 2021-11-04 DIAGNOSIS — N3000 Acute cystitis without hematuria: Secondary | ICD-10-CM | POA: Diagnosis not present

## 2021-11-04 DIAGNOSIS — G35 Multiple sclerosis: Secondary | ICD-10-CM | POA: Diagnosis not present

## 2021-11-05 ENCOUNTER — Inpatient Hospital Stay (HOSPITAL_COMMUNITY): Payer: Medicare Other

## 2021-11-05 ENCOUNTER — Observation Stay (HOSPITAL_COMMUNITY)
Admission: AD | Admit: 2021-11-05 | Discharge: 2021-11-06 | Disposition: A | Discharge status: Home Health | Payer: Medicare Other | Source: Other Acute Inpatient Hospital | Attending: Internal Medicine | Admitting: Internal Medicine

## 2021-11-05 DIAGNOSIS — Z96652 Presence of left artificial knee joint: Secondary | ICD-10-CM | POA: Diagnosis not present

## 2021-11-05 DIAGNOSIS — G35 Multiple sclerosis: Principal | ICD-10-CM | POA: Diagnosis present

## 2021-11-05 DIAGNOSIS — Z79899 Other long term (current) drug therapy: Secondary | ICD-10-CM | POA: Diagnosis not present

## 2021-11-05 DIAGNOSIS — F444 Conversion disorder with motor symptom or deficit: Principal | ICD-10-CM

## 2021-11-05 DIAGNOSIS — I10 Essential (primary) hypertension: Secondary | ICD-10-CM | POA: Diagnosis not present

## 2021-11-05 DIAGNOSIS — R5383 Other fatigue: Secondary | ICD-10-CM | POA: Diagnosis not present

## 2021-11-05 DIAGNOSIS — G919 Hydrocephalus, unspecified: Secondary | ICD-10-CM | POA: Diagnosis not present

## 2021-11-05 DIAGNOSIS — E1165 Type 2 diabetes mellitus with hyperglycemia: Secondary | ICD-10-CM | POA: Diagnosis not present

## 2021-11-05 DIAGNOSIS — Z91040 Latex allergy status: Secondary | ICD-10-CM | POA: Insufficient documentation

## 2021-11-05 LAB — CBC WITH DIFFERENTIAL/PLATELET
Abs Immature Granulocytes: 0.09 10*3/uL — ABNORMAL HIGH (ref 0.00–0.07)
Basophils Absolute: 0 10*3/uL (ref 0.0–0.1)
Basophils Relative: 0 %
Eosinophils Absolute: 0 10*3/uL (ref 0.0–0.5)
Eosinophils Relative: 0 %
HCT: 37 % (ref 36.0–46.0)
Hemoglobin: 12 g/dL (ref 12.0–15.0)
Immature Granulocytes: 1 %
Lymphocytes Relative: 7 %
Lymphs Abs: 1 10*3/uL (ref 0.7–4.0)
MCH: 31.1 pg (ref 26.0–34.0)
MCHC: 32.4 g/dL (ref 30.0–36.0)
MCV: 95.9 fL (ref 80.0–100.0)
Monocytes Absolute: 1.1 10*3/uL — ABNORMAL HIGH (ref 0.1–1.0)
Monocytes Relative: 7 %
Neutro Abs: 12.9 10*3/uL — ABNORMAL HIGH (ref 1.7–7.7)
Neutrophils Relative %: 85 %
Platelets: 191 10*3/uL (ref 150–400)
RBC: 3.86 MIL/uL — ABNORMAL LOW (ref 3.87–5.11)
RDW: 14.4 % (ref 11.5–15.5)
WBC: 15 10*3/uL — ABNORMAL HIGH (ref 4.0–10.5)
nRBC: 0 % (ref 0.0–0.2)

## 2021-11-05 LAB — COMPREHENSIVE METABOLIC PANEL
ALT: 17 U/L (ref 0–44)
AST: 22 U/L (ref 15–41)
Albumin: 3.4 g/dL — ABNORMAL LOW (ref 3.5–5.0)
Alkaline Phosphatase: 68 U/L (ref 38–126)
Anion gap: 10 (ref 5–15)
BUN: 20 mg/dL (ref 8–23)
CO2: 24 mmol/L (ref 22–32)
Calcium: 9.5 mg/dL (ref 8.9–10.3)
Chloride: 109 mmol/L (ref 98–111)
Creatinine, Ser: 0.91 mg/dL (ref 0.44–1.00)
GFR, Estimated: >60 mL/min (ref >60)
Glucose, Bld: 130 mg/dL — ABNORMAL HIGH (ref 70–99)
Potassium: 4 mmol/L (ref 3.5–5.1)
Sodium: 143 mmol/L (ref 135–145)
Total Bilirubin: 0.5 mg/dL (ref 0.3–1.2)
Total Protein: 5.8 g/dL — ABNORMAL LOW (ref 6.5–8.1)

## 2021-11-05 LAB — URINALYSIS, ROUTINE W REFLEX MICROSCOPIC
Bilirubin Urine: NEGATIVE
Glucose, UA: NEGATIVE mg/dL
Hgb urine dipstick: NEGATIVE
Ketones, ur: NEGATIVE mg/dL
Nitrite: NEGATIVE
Protein, ur: NEGATIVE mg/dL
Specific Gravity, Urine: 1.009 (ref 1.005–1.030)
pH: 7 (ref 5.0–8.0)

## 2021-11-05 LAB — GLUCOSE, CAPILLARY: Glucose-Capillary: 124 mg/dL — ABNORMAL HIGH (ref 70–99)

## 2021-11-05 LAB — HEMOGLOBIN A1C
Hgb A1c MFr Bld: 6.4 % — ABNORMAL HIGH (ref 4.8–5.6)
Mean Plasma Glucose: 136.98 mg/dL

## 2021-11-05 LAB — MAGNESIUM: Magnesium: 1.9 mg/dL (ref 1.7–2.4)

## 2021-11-05 LAB — PHOSPHORUS: Phosphorus: 2.4 mg/dL — ABNORMAL LOW (ref 2.5–4.6)

## 2021-11-05 MED ORDER — POLYETHYLENE GLYCOL 3350 17 G PO PACK: 17.0000 g | PACK | Freq: Every day | ORAL | Status: DC | PRN

## 2021-11-05 MED ORDER — HYDRALAZINE HCL 20 MG/ML IJ SOLN: 5.0000 mg | Freq: Four times a day (QID) | INTRAMUSCULAR | Status: DC | PRN

## 2021-11-05 MED ORDER — INSULIN ASPART 100 UNIT/ML IJ SOLN: 0.0000 [IU] | Freq: Every day | INTRAMUSCULAR | Status: DC

## 2021-11-05 MED ORDER — PROCHLORPERAZINE EDISYLATE 10 MG/2ML IJ SOLN: 5.0000 mg | Freq: Four times a day (QID) | INTRAMUSCULAR | Status: DC | PRN

## 2021-11-05 MED ORDER — ACETAMINOPHEN 325 MG PO TABS: 650.0000 mg | ORAL_TABLET | Freq: Four times a day (QID) | ORAL | Status: DC | PRN

## 2021-11-05 MED ORDER — GADOBUTROL 1 MMOL/ML IV SOLN
10.0000 mL | Freq: Once | INTRAVENOUS | Status: AC | PRN
  Administered 2021-11-05: 10 mL via INTRAVENOUS

## 2021-11-05 MED ORDER — SODIUM CHLORIDE 0.9 % IV SOLN
2.0000 g | Freq: Every day | INTRAVENOUS | Status: DC
  Administered 2021-11-05: 2 g via INTRAVENOUS
  Filled 2021-11-05 (×2): qty 20

## 2021-11-05 MED ORDER — LORAZEPAM 2 MG/ML IJ SOLN
0.5000 mg | Freq: Once | INTRAMUSCULAR | Status: AC
  Administered 2021-11-06: 0.5 mg via INTRAVENOUS
  Filled 2021-11-05: qty 1

## 2021-11-05 MED ORDER — INSULIN ASPART 100 UNIT/ML IJ SOLN
0.0000 [IU] | Freq: Three times a day (TID) | INTRAMUSCULAR | Status: DC
  Administered 2021-11-06: 1 [IU] via SUBCUTANEOUS

## 2021-11-05 MED ORDER — MELATONIN 5 MG PO TABS
5.0000 mg | ORAL_TABLET | Freq: Every evening | ORAL | Status: DC | PRN
  Administered 2021-11-05: 5 mg via ORAL
  Filled 2021-11-05: qty 1

## 2021-11-05 MED ORDER — ENOXAPARIN SODIUM 40 MG/0.4ML IJ SOSY
40.0000 mg | PREFILLED_SYRINGE | INTRAMUSCULAR | Status: DC
  Administered 2021-11-05: 40 mg via SUBCUTANEOUS
  Filled 2021-11-05: qty 0.4

## 2021-11-05 NOTE — H&P (Signed)
History and Physical  MILILANI MURTHY NAT:[id number] DOB: 1950/06/15 DOA: 11/05/2021  Referring physician: Accepted by Dr. Thereasa Solo, [facility], on 11/04/21  PCP: Mateo Flow, MD  Outpatient Specialists: Rheumatology Patient coming from: Direct transfer from [facility] to [place] unit  Chief Complaint: Severe fatigue with concern for possible MS flare.   HPI: Carol Green is a 71 y.o. female with medical history significant for multiple sclerosis, hypertension, who initially presented to [place] with complaints of severe fatigue and intermittent tremors involving all 4 extremities, migrating form left to right for the past 3 to 4 days.  First noted the tremors while she was sitting in church on Sunday.  Last MRI brain with and without contrast done in 2022, was noted to have findings of stable multiple sclerosis.  CT head done in the ED at [facility] was nonacute.  Work-up was essentially unrevealing with results of CBC and chemistry panel.  UA was positive for pyuria.  Telemetry neuro was consulted and recommended neurology evaluation and MRI brain with and without contrast.  Consideration for high-dose steroids if MRI findings are consistent with MS flare.  TRH, hospitalist service, was asked to admit as the MRI scanner at [facility] was non-operable.  The patient was accepted to [place] unit at [facility].  ED Course: Tmax 98.2.  BP 152/104, pulse 76, respiration rate 17, O2 saturation 97% on room air.  Review of Systems: Review of systems as noted in the HPI. All other systems reviewed and are negative.   Past Medical History:  Diagnosis Date   Acid reflux    Alopecia    Arthritis    Chronic cystitis    Diverticulosis    Hypertension    Multiple sclerosis ([place])    Varicose veins    Past Surgical History:  Procedure Laterality Date   ABDOMINAL HYSTERECTOMY  1994   CHOLECYSTECTOMY  1987   FRACTURE SURGERY Left 2002   OTHER SURGICAL HISTORY Left  2004   removal fatty tumor left arm   SHOULDER SURGERY Bilateral 2003 - right; 2008 - left   TOTAL KNEE ARTHROPLASTY Left 08/01/2019   Procedure: TOTAL KNEE ARTHROPLASTY;  Surgeon: Paralee Cancel, MD;  Location: [facility] ORS;  Service: Orthopedics;  Laterality: Left;  70 mins   TUBAL LIGATION  1977    Social History:  reports that she has never smoked. She has never used smokeless tobacco. She reports that she does not drink alcohol and does not use drugs.   Allergies  Allergen Reactions   Codeine Anaphylaxis and Other (See Comments)   Latex     Other reaction(s): Other (See Comments)    Tape Rash    Pt can use paper tape    Family History  Problem Relation Age of Onset   Cancer Sister        breast   Deep vein thrombosis Brother    Other Brother        varicose veins   Diabetes Sister    Deep vein thrombosis Sister    Hypertension Sister    Hyperlipidemia Sister    Other Sister        varicose veins   Breast cancer Sister    Cancer Mother        ovarian   Other Father        varicose veins   Deep vein thrombosis Father       Prior to Admission medications   Medication Sig Start Date End  Date Taking? Authorizing Provider  acetaminophen (TYLENOL) 500 MG tablet Take 2 tablets (1,000 mg total) by mouth every 8 (eight) hours. 08/02/19   Danae Orleans, PA-C  atorvastatin (LIPITOR) 20 MG tablet Take 40 mg by mouth at bedtime. 06/23/19   [provider]  CRANBERRY PO Take 1 tablet by mouth daily at 12 noon.    [provider]  docusate sodium (COLACE) 100 MG capsule Take 1 capsule (100 mg total) by mouth 2 (two) times daily. 08/02/19   Danae Orleans, PA-C  ferrous sulfate (FERROUSUL) 325 (65 FE) MG tablet Take 1 tablet (325 mg total) by mouth 3 (three) times daily with meals for 14 days. 08/02/19 08/16/19  Danae Orleans, PA-C  Fingolimod HCl 0.5 MG CAPS Take 0.5 mg by mouth daily.     [provider]  gabapentin (NEURONTIN) 600 MG tablet Take 600 mg by  mouth in the morning, at noon, in the evening, and at bedtime. 07/05/19   [provider]  HYDROmorphone (DILAUDID) 2 MG tablet Take 1-2 tablets (2-4 mg total) by mouth every 6 (six) hours as needed for severe pain. 08/02/19   Danae Orleans, PA-C  lisinopril-hydrochlorothiazide (ZESTORETIC) 20-12.5 MG tablet Take 1 tablet by mouth daily. 06/23/19   [provider]  loratadine (CLARITIN) 10 MG tablet Take 10 mg by mouth at bedtime.    [provider]  methocarbamol (ROBAXIN) 500 MG tablet Take 1 tablet (500 mg total) by mouth every 6 (six) hours as needed for muscle spasms. 08/02/19   Danae Orleans, PA-C  Multiple Vitamin (MULTIVITAMIN WITH MINERALS) TABS tablet Take 1 tablet by mouth daily at 12 noon.    [provider]  Multiple Vitamins-Minerals (HAIR/SKIN/NAILS/BIOTIN PO) Take 1 tablet by mouth daily with supper.    [provider]  pantoprazole (PROTONIX) 40 MG tablet Take 40 mg by mouth 2 (two) times daily. Before breakfast & before supper 04/09/19   [provider]  polyethylene glycol (MIRALAX / GLYCOLAX) 17 g packet Take 17 g by mouth 2 (two) times daily. 08/02/19   Danae Orleans, PA-C    Physical Exam: BP (!) 73/104 (BP Location: Right Arm)   Pulse 76   Temp 98.2 F (36.8 C) (Oral)   Resp 17   SpO2 97%   General: 71 y.o. year-old female well developed well nourished in no acute distress.  Alert and oriented x3. Cardiovascular: Regular rate and rhythm with no rubs or gallops.  No thyromegaly or JVD noted.  No lower extremity edema. 2/4 pulses in all 4 extremities. Respiratory: Clear to auscultation with no wheezes or rales. Good inspiratory effort. Abdomen: Soft nontender nondistended with normal bowel sounds x4 quadrants. Muskuloskeletal: No cyanosis, clubbing or edema noted bilaterally Neuro: CN II-XII intact, strength, sensation, reflexes Skin: No ulcerative lesions noted or rashes Psychiatry: Judgement and insight appear  normal. Mood is appropriate for condition and setting          Labs on Admission:  Basic Metabolic Panel: No results for input(s): "NA", "K", "CL", "CO2", "GLUCOSE", "BUN", "CREATININE", "CALCIUM", "MG", "PHOS" in the last 168 hours. Liver Function Tests: No results for input(s): "AST", "ALT", "ALKPHOS", "BILITOT", "PROT", "ALBUMIN" in the last 168 hours. No results for input(s): "LIPASE", "AMYLASE" in the last 168 hours. No results for input(s): "AMMONIA" in the last 168 hours. CBC: No results for input(s): "WBC", "NEUTROABS", "HGB", "HCT", "MCV", "PLT" in the last 168 hours. Cardiac Enzymes: No results for input(s): "CKTOTAL", "CKMB", "CKMBINDEX", "TROPONINI" in the last 168 hours.  BNP (last 3 results) No results for input(s): "BNP" in the last 8760 hours.  ProBNP (last 3 results) No results for input(s): "PROBNP" in the last 8760 hours.  CBG: No results for input(s): "GLUCAP" in the last 168 hours.  Radiological Exams on Admission: No results found.  EKG: I independently viewed the EKG done and my findings are as followed: None available at this time with visit.  Assessment/Plan Present on Admission:  Fatigue  Principal Problem:   Fatigue  Severe fatigue, intermittent tremors without loss of consciousness, unclear etiology Rule out MS flare Treat underlying conditions, presumptive UTI, POA. Follow urine culture for ID and sensitivities Start Rocephin empirically Follow MRI brain with and without contrast to rule out MS flare If MRI brain consistent with MS flare start high-dose steroids. PT OT assessment in the AM Fall precautions  Essential hypertension BP is not at goal, elevated Resume home oral antihypertensives after medications have been reconciled. Monitor vital signs IV hydralazine as needed with parameters.  Type 2 diabetes with hyperglycemia Obtain hemoglobin A1c Start insulin sliding scale.   DVT prophylaxis: Subcu Lovenox daily  Code Status:  Full code  Family Communication: None at bedside  Disposition Plan: Admitted to [place] unit  Consults called: Neurology  Admission status: Inpatient status.   Status is: Inpatient The patient requires at least 2 midnights for further evaluation and treatment of present condition.   Kayleen Memos MD [facility] Pager [phone]  If 7PM-7AM, please contact night-coverage [email or web address] Password [facility]  11/05/2021, 9:47 PM

## 2021-11-06 DIAGNOSIS — I10 Essential (primary) hypertension: Secondary | ICD-10-CM | POA: Diagnosis not present

## 2021-11-06 DIAGNOSIS — R5383 Other fatigue: Secondary | ICD-10-CM | POA: Diagnosis not present

## 2021-11-06 DIAGNOSIS — G35 Multiple sclerosis: Secondary | ICD-10-CM

## 2021-11-06 DIAGNOSIS — Z79899 Other long term (current) drug therapy: Secondary | ICD-10-CM | POA: Diagnosis not present

## 2021-11-06 DIAGNOSIS — Z91040 Latex allergy status: Secondary | ICD-10-CM | POA: Diagnosis not present

## 2021-11-06 DIAGNOSIS — Z96652 Presence of left artificial knee joint: Secondary | ICD-10-CM | POA: Diagnosis not present

## 2021-11-06 DIAGNOSIS — E1165 Type 2 diabetes mellitus with hyperglycemia: Secondary | ICD-10-CM | POA: Diagnosis not present

## 2021-11-06 LAB — URINE CULTURE
Culture: NO GROWTH
Special Requests: NORMAL

## 2021-11-06 LAB — GLUCOSE, CAPILLARY
Glucose-Capillary: 108 mg/dL — ABNORMAL HIGH (ref 70–99)
Glucose-Capillary: 143 mg/dL — ABNORMAL HIGH (ref 70–99)
Glucose-Capillary: 105 mg/dL — ABNORMAL HIGH (ref 70–99)

## 2021-11-06 MED ORDER — ORAL CARE MOUTH RINSE: 15.0000 mL | OROMUCOSAL | Status: DC | PRN

## 2021-11-06 MED ORDER — DULOXETINE HCL 20 MG PO CPEP: 20.0000 mg | ORAL_CAPSULE | Freq: Every day | ORAL | 2 refills | Status: AC

## 2021-11-06 NOTE — Plan of Care (Signed)
  Problem: Education: Goal: Knowledge of General Education information will improve Description: Including pain rating scale, medication(s)/side effects and non-pharmacologic comfort measures Outcome: Adequate for Discharge   Problem: Health Behavior/Discharge Planning: Goal: Ability to manage health-related needs will improve Outcome: Adequate for Discharge   Problem: Clinical Measurements: Goal: Ability to maintain clinical measurements within normal limits will improve Outcome: Adequate for Discharge Goal: Will remain free from infection Outcome: Adequate for Discharge Goal: Diagnostic test results will improve Outcome: Adequate for Discharge Goal: Respiratory complications will improve Outcome: Adequate for Discharge Goal: Cardiovascular complication will be avoided Outcome: Adequate for Discharge   Problem: Activity: Goal: Risk for activity intolerance will decrease Outcome: Adequate for Discharge   Problem: Nutrition: Goal: Adequate nutrition will be maintained Outcome: Adequate for Discharge   Problem: Coping: Goal: Level of anxiety will decrease Outcome: Adequate for Discharge   Problem: Elimination: Goal: Will not experience complications related to bowel motility Outcome: Adequate for Discharge Goal: Will not experience complications related to urinary retention Outcome: Adequate for Discharge   Problem: Pain Managment: Goal: General experience of comfort will improve Outcome: Adequate for Discharge   Problem: Safety: Goal: Ability to remain free from injury will improve Outcome: Adequate for Discharge   Problem: Skin Integrity: Goal: Risk for impaired skin integrity will decrease Outcome: Adequate for Discharge   Problem: Education: Goal: Ability to describe self-care measures that may prevent or decrease complications (Diabetes Survival Skills Education) will improve Outcome: Adequate for Discharge Goal: Individualized Educational Video(s) Outcome:  Adequate for Discharge   Problem: Coping: Goal: Ability to adjust to condition or change in health will improve Outcome: Adequate for Discharge   Problem: Fluid Volume: Goal: Ability to maintain a balanced intake and output will improve Outcome: Adequate for Discharge   Problem: Health Behavior/Discharge Planning: Goal: Ability to identify and utilize available resources and services will improve Outcome: Adequate for Discharge Goal: Ability to manage health-related needs will improve Outcome: Adequate for Discharge   Problem: Metabolic: Goal: Ability to maintain appropriate glucose levels will improve Outcome: Adequate for Discharge   Problem: Nutritional: Goal: Maintenance of adequate nutrition will improve Outcome: Adequate for Discharge Goal: Progress toward achieving an optimal weight will improve Outcome: Adequate for Discharge   Problem: Skin Integrity: Goal: Risk for impaired skin integrity will decrease Outcome: Adequate for Discharge   Problem: Tissue Perfusion: Goal: Adequacy of tissue perfusion will improve Outcome: Adequate for Discharge   Problem: Education: Goal: Knowledge of disease or condition will improve Outcome: Adequate for Discharge Goal: Knowledge of secondary prevention will improve (SELECT ALL) Outcome: Adequate for Discharge Goal: Knowledge of patient specific risk factors will improve (INDIVIDUALIZE FOR PATIENT) Outcome: Adequate for Discharge Goal: Individualized Educational Video(s) Outcome: Adequate for Discharge   Problem: Coping: Goal: Will verbalize positive feelings about self Outcome: Adequate for Discharge   Problem: Health Behavior/Discharge Planning: Goal: Ability to manage health-related needs will improve Outcome: Adequate for Discharge   Problem: Self-Care: Goal: Ability to participate in self-care as condition permits will improve Outcome: Adequate for Discharge Goal: Ability to communicate needs accurately will  improve Outcome: Adequate for Discharge   Problem: Nutrition: Goal: Risk of aspiration will decrease Outcome: Adequate for Discharge   Problem: Intracerebral Hemorrhage Tissue Perfusion: Goal: Complications of Intracerebral Hemorrhage will be minimized Outcome: Adequate for Discharge

## 2021-11-06 NOTE — Discharge Summary (Signed)
Physician Discharge Summary  Carol Green ZWC:[id number] DOB: [birth year] DOA: 11/05/2021  PCP: Carol Flow, MD  Admit date: 11/05/2021 Discharge date: 11/06/2021  Admitted From: Home Disposition:  Home  Recommendations for Outpatient Follow-up:  Follow up with PCP in 1-2 weeks Discuss stressors with PCP and follow up on recommendations for Cymbalta  Home Health:None  Equipment/Devices:None  Discharge Condition:Stable  CODE STATUS:Full  Diet recommendation: As tolerated    Brief/Interim Summary: Carol Green is a 71 y.o. female with medical history significant for multiple sclerosis, hypertension, who initially presented to [facility] with complaints of severe fatigue and intermittent violent tremors involving all 4 extremities, migrating form left to right for the past 3 to 4 days.    Patient admitted as above due to inability to perform MRI at outside facility.  Neurology consulted given patient's history of MS with concern for CVA on imaging.  After further review and based on MRI imaging as well as discussion with neurology patient does not appear to have either an MS flare or an acute CVA.  Given her abnormal movements there was concern for conversion disorder versus worsening anxiety given household stressors over the past few weeks to months.  At this time patient is back to baseline otherwise doing quite well.  No PT or OT follow-up.  Cymbalta was prescribed at discharge per discussion with neurology given patient's ongoing stressors.  Close follow-up with PCP in the next 1 to 2 weeks as scheduled to ensure tolerance of new medication.  Patient otherwise stable and agreeable for discharge home, family at bedside agrees.  Discharge Diagnoses:  Principal Problem:   Multiple sclerosis [facility])    Discharge Instructions  Discharge Instructions     Discharge patient   Complete by: As directed    Discharge disposition: 01-Home or Self Care   Discharge patient date:  11/06/2021      Allergies as of 11/06/2021       Reactions   Codeine Anaphylaxis, Other (See Comments)   Latex    Other reaction(s): Other (See Comments)   Tape Rash   Pt can use paper tape        Medication List     TAKE these medications    acetaminophen 500 MG tablet Commonly known as: TYLENOL Take 2 tablets (1,000 mg total) by mouth every 8 (eight) hours.   ALPRAZolam 0.5 MG tablet Commonly known as: XANAX Take 0.5 mg by mouth 2 (two) times daily as needed for anxiety.   aspirin EC 81 MG tablet Take 162 mg by mouth daily. Swallow whole.   atorvastatin 40 MG tablet Commonly known as: LIPITOR Take 40 mg by mouth at bedtime.   baclofen 10 MG tablet Commonly known as: LIORESAL Take 10 mg by mouth 3 (three) times daily as needed for muscle spasms.   CRANBERRY PO Take 2 tablets by mouth daily at 12 noon.   DULoxetine 20 MG capsule Commonly known as: Cymbalta Take 1 capsule (20 mg total) by mouth daily.   gabapentin 600 MG tablet Commonly known as: NEURONTIN Take 600 mg by mouth in the morning, at noon, in the evening, and at bedtime.   Gemtesa 75 MG Tabs Generic drug: Vibegron Take 1 tablet by mouth at bedtime.   HAIR/SKIN/NAILS/BIOTIN PO Take 1 tablet by mouth daily with supper.   lisinopril-hydrochlorothiazide 20-12.5 MG tablet Commonly known as: ZESTORETIC Take 1 tablet by mouth daily.   meloxicam 15 MG tablet Commonly known as: MOBIC Take 15 mg by  mouth daily as needed for pain.   multivitamin with minerals Tabs tablet Take 1 tablet by mouth daily at 12 noon.   ocrelizumab 300 mg in sodium chloride 0.9 % 250 mL Inject 300 mg into the vein every 6 (six) months.   pantoprazole 40 MG tablet Commonly known as: PROTONIX Take 40 mg by mouth 2 (two) times daily. Before breakfast & before supper   Systane Balance 0.6 % Soln Generic drug: Propylene Glycol Place 1 drop into both eyes daily as needed (dry eyes).   valACYclovir 1000 MG  tablet Commonly known as: VALTREX Take 1,000 mg by mouth daily.        Follow-up Information     [facility] home health Follow up.   Why: The home health agency will contact you for the Carol home visit Contact information: [phone]               Allergies  Allergen Reactions   Codeine Anaphylaxis and Other (See Comments)   Latex     Other reaction(s): Other (See Comments)    Tape Rash    Pt can use paper tape    Consultations: Neuro  Procedures/Studies: MR BRAIN W WO CONTRAST  Result Date: 11/06/2021 CLINICAL DATA:  Initial evaluation for multiple sclerosis. EXAM: MRI HEAD WITHOUT AND WITH CONTRAST TECHNIQUE: Multiplanar, multiecho pulse sequences of the brain and surrounding structures were obtained without and with intravenous contrast. CONTRAST:  44m GADAVIST GADOBUTROL 1 MMOL/ML IV SOLN COMPARISON:  Comparison made with most recent available MRI from 12/08/2018. FINDINGS: Brain: Cerebral volume stable, and within normal limits for age. Again seen are patchy and confluent T2/FLAIR hyperintensities involving the periventricular, deep, and juxta cortical white matter of both cerebral hemispheres. Patchy involvement of the pons present. In comparison with most recent MRI from 2020, these changes are slightly increased in prominence, suggesting mild progression. A component of superimposed chronic microvascular ischemic changes could be contributory as well. Probable superimposed tiny remote lacunar infarct noted at the left thalamus. No associated restricted diffusion or enhancement to suggest active demyelination. No other evidence for acute or subacute infarct. Gray-white matter differentiation maintained. No areas of chronic cortical infarction. No acute or chronic intracranial blood products. Subcentimeter somewhat nodular focus of extra-axial enhancement overlying the high right frontal convexity noted, stable from prior, and favored to be vascular in nature. No other  mass lesion, mass effect, or midline shift. Ventricles normal size of hydrocephalus. No extra-axial fluid collection. Pituitary gland and suprasellar region within normal limits. No other abnormal enhancement. Vascular: Major intracranial vascular Green voids are maintained. Skull and upper cervical spine: Craniocervical junction within normal limits. Bone marrow signal intensity normal. No scalp soft tissue abnormality. Sinuses/Orbits: Patient status post bilateral ocular lens replacement. Globes and orbital soft tissues demonstrate no acute finding. Paranasal sinuses are largely clear. No mastoid effusion. Other: None. IMPRESSION: 1. Moderate patchy and confluent T2/FLAIR hyperintensities involving the supratentorial cerebral white matter and pons. Findings are nonspecific, but presumably in large part related to provided history of multiple sclerosis, although a superimposed component of chronic microvascular ischemic disease could be contributory as well. Overall, these changes are mildly progressed as compared to most recent available MRI from 12/08/2018. No evidence for active demyelination to suggest acute MS flare. 2. No other acute intracranial abnormality. Electronically Signed   By: BJeannine BogaM.D.   On: 11/06/2021 01:45     Subjective: No acute issues/events overnight   Discharge Exam: Vitals:   11/06/21 0844 11/06/21 1630  BP:  138/63 138/65  Pulse: 75 67  Resp: 18 18  Temp: 98.1 F (36.7 C) 98 F (36.7 C)  SpO2: 95% 95%   Vitals:   11/06/21 0100 11/06/21 0327 11/06/21 0844 11/06/21 1630  BP: 135/60 126/64 138/63 138/65  Pulse: 75 70 75 67  Resp: '16 16 18 18  '$ Temp: 97.9 F (36.6 C) 98 F (36.7 C) 98.1 F (36.7 C) 98 F (36.7 C)  TempSrc: Oral Oral Oral Oral  SpO2: 96% 96% 95% 95%    General: Pt is alert, awake, not in acute distress Cardiovascular: RRR, S1/S2 +, no rubs, no gallops Respiratory: CTA bilaterally, no wheezing, no rhonchi Abdominal: Soft, NT, ND,  bowel sounds + Extremities: no edema, no cyanosis    The results of significant diagnostics from this hospitalization (including imaging, microbiology, ancillary and laboratory) are listed below for reference.     Microbiology: No results found for this or any previous visit (from the past 240 hour(s)).   Labs: BNP (last 3 results) No results for input(s): "BNP" in the last 8760 hours. Basic Metabolic Panel: Recent Labs  Lab 11/05/21 2212  NA 143  K 4.0  CL 109  CO2 24  GLUCOSE 130*  BUN 20  CREATININE 0.91  CALCIUM 9.5  MG 1.9  PHOS 2.4*   Liver Function Tests: Recent Labs  Lab 11/05/21 2212  AST 22  ALT 17  ALKPHOS 68  BILITOT 0.5  PROT 5.8*  ALBUMIN 3.4*   No results for input(s): "LIPASE", "AMYLASE" in the last 168 hours. No results for input(s): "AMMONIA" in the last 168 hours. CBC: Recent Labs  Lab 11/05/21 2212  WBC 15.0*  NEUTROABS 12.9*  HGB 12.0  HCT 37.0  MCV 95.9  PLT 191   Cardiac Enzymes: No results for input(s): "CKTOTAL", "CKMB", "CKMBINDEX", "TROPONINI" in the last 168 hours. BNP: Invalid input(s): "POCBNP" CBG: Recent Labs  Lab 11/05/21 2217 11/06/21 0633 11/06/21 1129 11/06/21 1643  GLUCAP 124* 108* 143* 105*   D-Dimer No results for input(s): "DDIMER" in the last 72 hours. Hgb A1c Recent Labs    11/05/21 2212  HGBA1C 6.4*   Lipid Profile No results for input(s): "CHOL", "HDL", "LDLCALC", "TRIG", "CHOLHDL", "LDLDIRECT" in the last 72 hours. Thyroid function studies No results for input(s): "TSH", "T4TOTAL", "T3FREE", "THYROIDAB" in the last 72 hours.  Invalid input(s): "FREET3" Anemia work up No results for input(s): "VITAMINB12", "FOLATE", "FERRITIN", "TIBC", "IRON", "RETICCTPCT" in the last 72 hours. Urinalysis    Component Value Date/Time   COLORURINE YELLOW 11/05/2021 2200   APPEARANCEUR CLEAR 11/05/2021 2200   LABSPEC 1.009 11/05/2021 2200   PHURINE 7.0 11/05/2021 2200   GLUCOSEU NEGATIVE 11/05/2021 2200    HGBUR NEGATIVE 11/05/2021 2200   BILIRUBINUR NEGATIVE 11/05/2021 2200   KETONESUR NEGATIVE 11/05/2021 2200   PROTEINUR NEGATIVE 11/05/2021 2200   NITRITE NEGATIVE 11/05/2021 2200   LEUKOCYTESUR LARGE (A) 11/05/2021 2200   Sepsis Labs Recent Labs  Lab 11/05/21 2212  WBC 15.0*   Microbiology No results found for this or any previous visit (from the past 240 hour(s)).   Time coordinating discharge: Over 30 minutes  SIGNED:   Little Ishikawa, DO [facility] 11/06/2021, 5:23 PM Pager   If 7PM-7AM, please contact night-coverage [email or web address]

## 2021-11-06 NOTE — Progress Notes (Signed)
Order to discharge pt home.  Discharge instructions/AVS given to patient and reviewed - education provided as needed.  Pt advised to call PCP and/or come back to the hospital if there are any problems. Pt verbalized understanding.    

## 2021-11-06 NOTE — Care Management CC44 (Signed)
Condition Code 44 Documentation Completed  Patient Details  Name: Carol Green MRN: [id number] Date of Birth: 1950-04-08   Condition Code 44 given:  Yes Patient signature on Condition Code 44 notice:  Yes Documentation of 2 MD's agreement:  Yes Code 44 added to claim:  Yes    Laurena Slimmer, RN 11/06/2021, 6:20 PM

## 2021-11-06 NOTE — Plan of Care (Signed)
  Problem: Education: Goal: Knowledge of General Education information will improve Description: Including pain rating scale, medication(s)/side effects and non-pharmacologic comfort measures Outcome: Progressing   Problem: Health Behavior/Discharge Planning: Goal: Ability to manage health-related needs will improve Outcome: Progressing   Problem: Clinical Measurements: Goal: Ability to maintain clinical measurements within normal limits will improve Outcome: Progressing Goal: Will remain free from infection Outcome: Progressing Goal: Diagnostic test results will improve Outcome: Progressing Goal: Respiratory complications will improve Outcome: Progressing Goal: Cardiovascular complication will be avoided Outcome: Progressing   Problem: Activity: Goal: Risk for activity intolerance will decrease Outcome: Progressing   Problem: Nutrition: Goal: Adequate nutrition will be maintained Outcome: Progressing   Problem: Coping: Goal: Level of anxiety will decrease Outcome: Progressing   Problem: Elimination: Goal: Will not experience complications related to bowel motility Outcome: Progressing Goal: Will not experience complications related to urinary retention Outcome: Progressing   Problem: Pain Managment: Goal: General experience of comfort will improve Outcome: Progressing   Problem: Safety: Goal: Ability to remain free from injury will improve Outcome: Progressing   Problem: Skin Integrity: Goal: Risk for impaired skin integrity will decrease Outcome: Progressing   Problem: Education: Goal: Ability to describe self-care measures that may prevent or decrease complications (Diabetes Survival Skills Education) will improve Outcome: Progressing Goal: Individualized Educational Video(s) Outcome: Progressing   Problem: Coping: Goal: Ability to adjust to condition or change in health will improve Outcome: Progressing   Problem: Fluid Volume: Goal: Ability to  maintain a balanced intake and output will improve Outcome: Progressing   Problem: Health Behavior/Discharge Planning: Goal: Ability to identify and utilize available resources and services will improve Outcome: Progressing Goal: Ability to manage health-related needs will improve Outcome: Progressing   Problem: Metabolic: Goal: Ability to maintain appropriate glucose levels will improve Outcome: Progressing   Problem: Nutritional: Goal: Maintenance of adequate nutrition will improve Outcome: Progressing Goal: Progress toward achieving an optimal weight will improve Outcome: Progressing   Problem: Skin Integrity: Goal: Risk for impaired skin integrity will decrease Outcome: Progressing   Problem: Tissue Perfusion: Goal: Adequacy of tissue perfusion will improve Outcome: Progressing   Problem: Education: Goal: Knowledge of disease or condition will improve Outcome: Progressing Goal: Knowledge of secondary prevention will improve (SELECT ALL) Outcome: Progressing Goal: Knowledge of patient specific risk factors will improve (INDIVIDUALIZE FOR PATIENT) Outcome: Progressing Goal: Individualized Educational Video(s) Outcome: Progressing   Problem: Coping: Goal: Will verbalize positive feelings about self Outcome: Progressing   Problem: Health Behavior/Discharge Planning: Goal: Ability to manage health-related needs will improve Outcome: Progressing   Problem: Self-Care: Goal: Ability to participate in self-care as condition permits will improve Outcome: Progressing Goal: Ability to communicate needs accurately will improve Outcome: Progressing   Problem: Nutrition: Goal: Risk of aspiration will decrease Outcome: Progressing   Problem: Intracerebral Hemorrhage Tissue Perfusion: Goal: Complications of Intracerebral Hemorrhage will be minimized Outcome: Progressing

## 2021-11-06 NOTE — Consult Note (Signed)
Neurology Consultation  Referring physician-Dr. Avon Gully Reason for consultation-episode of shaking  CC: Shaking episodes  History is obtained from: Chart, patient  HPI: Carol Green is a 71 y.o. female past medical history multiple sclerosis with no flares in many years, currently on eculizumab, hypertension, arthritis, presented to the emergency room from [place] essentially for MR imaging since their MRI machine is broken. She came to [facility] with complaints of severe fatigue and intermittent tremors that involves all 4 extremities.  These episodes were flailing of her limbs and then she also started having episodes of head thrusting back-and-forth.  She remain coherent and cognizant during these episodes. Family confirmed the description of these episodes. She was seen by telestroke/telemetry neuro service and recommended to get an MRI for which she was transferred to [facility] and neurological consultation was obtained for further recommendations. She describes no prior tremors like symptoms in the past. She describes a recent diagnosis of cancer for her husband 2 months ago that is stressed and was exceptionally.  There have also been other psychosocial stressors that the family said are too many to count. She is not on any antianxiety or antidepressant medications.    ROS: Full ROS was performed and is negative except as noted in the HPI.  Past Medical History:  Diagnosis Date   Acid reflux    Alopecia    Arthritis    Chronic cystitis    Diverticulosis    Hypertension    Multiple sclerosis ([place])    Varicose veins      Family History  Problem Relation Age of Onset   Cancer Sister        breast   Deep vein thrombosis Brother    Other Brother        varicose veins   Diabetes Sister    Deep vein thrombosis Sister    Hypertension Sister    Hyperlipidemia Sister    Other Sister        varicose veins   Breast cancer Sister    Cancer Mother        ovarian    Other Father        varicose veins   Deep vein thrombosis Father      Social History:   reports that she has never smoked. She has never used smokeless tobacco. She reports that she does not drink alcohol and does not use drugs.  Medications  Current Facility-Administered Medications:    acetaminophen (TYLENOL) tablet 650 mg, 650 mg, Oral, Q6H PRN, [place] Crane, Carole N, DO   cefTRIAXone (ROCEPHIN) 2 g in sodium chloride 0.9 % 100 mL IVPB, 2 g, Intravenous, QHS, Hall, Carole N, DO, Stopped at 11/05/21 2304   enoxaparin (LOVENOX) injection 40 mg, 40 mg, Subcutaneous, Q24H, Hall, Carole N, DO, 40 mg at 11/05/21 2205   hydrALAZINE (APRESOLINE) injection 5 mg, 5 mg, Intravenous, Q6H PRN, Hall, Carole N, DO   insulin aspart (novoLOG) injection 0-5 Units, 0-5 Units, Subcutaneous, QHS, Hall, Carole N, DO   insulin aspart (novoLOG) injection 0-9 Units, 0-9 Units, Subcutaneous, TID WC, Hall, Carole N, DO, 1 Units at 11/06/21 1143   melatonin tablet 5 mg, 5 mg, Oral, QHS PRN, Irene Pap N, DO, 5 mg at 11/05/21 2218   Oral care mouth rinse, 15 mL, Mouth Rinse, PRN, Little Ishikawa, MD   polyethylene glycol (MIRALAX / GLYCOLAX) packet 17 g, 17 g, Oral, Daily PRN, Irene Pap N, DO   prochlorperazine (COMPAZINE) injection 5 mg, 5 mg, Intravenous,  Q6H PRN, Kayleen Memos, DO   Exam: Current vital signs: BP 138/65 (BP Location: Left Arm)   Pulse 67   Temp 98 F (36.7 C) (Oral)   Resp 18   SpO2 95%  Vital signs in last 24 hours: Temp:  [97.9 F (36.6 C)-98.2 F (36.8 C)] 98 F (36.7 C) (10/05 1630) Pulse Rate:  [67-76] 67 (10/05 1630) Resp:  [16-18] 18 (10/05 1630) BP: (126-152)/(60-104) 138/65 (10/05 1630) SpO2:  [95 %-97 %] 95 % (10/05 1630) General: Awake alert in no distress HEENT: Normocephalic dermatic Lungs are clear to auscultation Cardiovascular: Regular rhythm Abdomen nondistended nontender Extremities warm well perfused Mood is appropriate but upon asking her about her  husband's cancer diagnosis, she becomes tearful even at the mention of his diagnosis.  Neurological exam Awake alert oriented x3 Speech is nondysarthric No evidence of aphasia Cranial nerves II to XII intact Motor examination with no drift in any of the 4 extremities Sensation diminished on the left that is baseline. Coordination with no dysmetria    Labs I have reviewed labs in epic and the results pertinent to this consultation are: CBC    Component Value Date/Time   WBC 15.0 (H) 11/05/2021 2212   RBC 3.86 (L) 11/05/2021 2212   HGB 12.0 11/05/2021 2212   HCT 37.0 11/05/2021 2212   PLT 191 11/05/2021 2212   MCV 95.9 11/05/2021 2212   MCH 31.1 11/05/2021 2212   MCHC 32.4 11/05/2021 2212   RDW 14.4 11/05/2021 2212   LYMPHSABS 1.0 11/05/2021 2212   MONOABS 1.1 (H) 11/05/2021 2212   EOSABS 0.0 11/05/2021 2212   BASOSABS 0.0 11/05/2021 2212    CMP     Component Value Date/Time   NA 143 11/05/2021 2212   K 4.0 11/05/2021 2212   CL 109 11/05/2021 2212   CO2 24 11/05/2021 2212   GLUCOSE 130 (H) 11/05/2021 2212   BUN 20 11/05/2021 2212   CREATININE 0.91 11/05/2021 2212   CALCIUM 9.5 11/05/2021 2212   PROT 5.8 (L) 11/05/2021 2212   ALBUMIN 3.4 (L) 11/05/2021 2212   AST 22 11/05/2021 2212   ALT 17 11/05/2021 2212   ALKPHOS 68 11/05/2021 2212   BILITOT 0.5 11/05/2021 2212   GFRNONAA >60 11/05/2021 2212   GFRAA >60 08/02/2019 0247    Imaging I have reviewed the images obtained:  MRI brain with evidence of T2/FLAIR hyperintensities which are consistent with known diagnosis of MS.  No acute stroke.  No abnormal enhancement.  Assessment:  71 year old presenting with episodes of increasing fatigue as well as episodes of hand flailing which she describes as tremors and head jerking during which her cognition and wakefulness are preserved. I suspect this to be psychogenic episodes based on description especially given the recent flurry of psychosocial stressors that she has  had. She has a follow-up appointment with her [facility] neurologist-should continue outpatient.  Do not feel these are seizures since do not feel a strong need for EEG inpatient at this time.  If suspicion exists and recurrence of symptoms occurs, can do EEG at that time.  Recommendations: Follow-up with outpatient neurology No further inpatient neurological work-up at this time For some endorsed depressive symptoms, can start a trial of Cymbalta. Plan discussed with Dr. Avon Gully and patient's family and patient at bedside.    -- Amie Portland, MD Neurologist [facility] Pager: [phone]

## 2021-11-06 NOTE — Evaluation (Signed)
Physical Therapy Evaluation  Patient Details Name: Carol Green MRN: [id number] DOB: 03/18/1950 Today's Date: 11/06/2021  History of Present Illness  Pt is a 71 y/o female who presents with tremor and decreased mobility/AMS. Suspected UTI. MRI negative for acute changes or MS flare. PMH significant for MS,alopecia, diverticulosis, HTN, chronic cystitis, L THA, B shoulder surgery.   Clinical Impression  Pt admitted with above diagnosis. Pt currently with functional limitations due to the deficits listed below (see PT Problem List). At the time of PT eval pt was able to perform transfers and ambulation with gross min guard assist to min assist and RW for support. Pt reports she is not at her baseline of function and noted L side weakness which pt reports is baseline, but may be weaker than normal. As she fatigued, LLE lagging behind during gait training. Recommend HHPT follow up to maximize functional return. Acutely, pt will benefit from skilled PT to increase their independence and safety with mobility to allow discharge to the venue listed below.          Recommendations for follow up therapy are one component of a multi-disciplinary discharge planning process, led by the attending physician.  Recommendations may be updated based on patient status, additional functional criteria and insurance authorization.  Follow Up Recommendations Home health PT      Assistance Recommended at Discharge Frequent or constant Supervision/Assistance  Patient can return home with the following  A little help with walking and/or transfers;A little help with bathing/dressing/bathroom;Assistance with cooking/housework;Assist for transportation;Help with stairs or ramp for entrance    Equipment Recommendations None recommended by PT  Recommendations for Other Services       Functional Status Assessment Patient has had a recent decline in their functional status and demonstrates the ability to make significant  improvements in function in a reasonable and predictable amount of time.     Precautions / Restrictions Precautions Precautions: Fall Restrictions Weight Bearing Restrictions: No      Mobility  Bed Mobility               General bed mobility comments: Pt received sitting up in the recliner.    Transfers Overall transfer level: Needs assistance Equipment used: None Transfers: Sit to/from Stand Sit to Stand: Min guard           General transfer comment: Hands on guarding for safety. Pt appears unsteady but was able to recover without assistance.    Ambulation/Gait Ambulation/Gait assistance: Min assist Gait Distance (Feet): 150 Feet Assistive device: Rolling walker (2 wheels) Gait Pattern/deviations: Step-through pattern, Decreased stride length, Trunk flexed, Narrow base of support, Decreased step length - left Gait velocity: Decreased Gait velocity interpretation: <1.8 ft/sec, indicate of risk for recurrent falls   General Gait Details: Pt very unsteady with initial ambulation without AD. With RW, pt improved however noted consistent drifting to the L. Assist required for walker management and balance.  Stairs            Wheelchair Mobility    Modified Rankin (Stroke Patients Only)       Balance Overall balance assessment: Needs assistance Sitting-balance support: Feet supported, No upper extremity supported Sitting balance-Leahy Scale: Fair Sitting balance - Comments: Effortful to maintain upright posture without UE support Postural control: Posterior lean Standing balance support: No upper extremity supported, During functional activity Standing balance-Leahy Scale: Poor Standing balance comment: UE support required for dynamic activity  Pertinent Vitals/Pain Pain Assessment Pain Assessment: Faces Faces Pain Scale: Hurts a little bit Pain Location: L side appears generally uncomfortable with movement  during ambulation. Pain Descriptors / Indicators: Discomfort Pain Intervention(s): Limited activity within patient's tolerance, Monitored during session, Repositioned    Home Living Family/patient expects to be discharged to:: Private residence Living Arrangements: Spouse/significant other Available Help at Discharge: Family;Available 24 hours/day Type of Home: House Home Access: Stairs to enter   [employer] of Steps: threshold step   Home Layout: Two level;Able to live on main level with bedroom/bathroom Home Equipment: Shower seat - built in;Rollator (4 wheels);Rolling Walker (2 wheels);Cane - single point;Grab bars - tub/shower      Prior Function Prior Level of Function : Independent/Modified Independent;Driving;History of Falls (last six months) (2 falls at home)                     Hand Dominance        Extremity/Trunk Assessment   Upper Extremity Assessment Upper Extremity Assessment: LUE deficits/detail;Defer to OT evaluation LUE Deficits / Details: Generally weaker than RUE in shoulder flexion, biceps, triceps. Grossly 4/5 to 4-/5 LUE Sensation: history of peripheral neuropathy    Lower Extremity Assessment Lower Extremity Assessment: LLE deficits/detail LLE Deficits / Details: Quads, hamstrings, hip flexors, ankle DF mildly weaker than RLE. Grossly 4/5 to 4+/5 LLE Sensation: history of peripheral neuropathy    Cervical / Trunk Assessment Cervical / Trunk Assessment: Other exceptions Cervical / Trunk Exceptions: Forward head posture with rounded shoulders  Communication   Communication: No difficulties  Cognition Arousal/Alertness: Awake/alert Behavior During Therapy: WFL for tasks assessed/performed Overall Cognitive Status: Impaired/Different from baseline Area of Impairment: Problem solving                             Problem Solving: Slow processing          General Comments      Exercises     Assessment/Plan     PT Assessment Patient needs continued PT services  PT Problem List Decreased strength;Decreased activity tolerance;Decreased balance;Decreased mobility;Decreased knowledge of use of DME;Decreased safety awareness;Decreased knowledge of precautions;Impaired sensation       PT Treatment Interventions DME instruction;Gait training;Stair training;Functional mobility training;Therapeutic activities;Therapeutic exercise;Balance training;Patient/family education    PT Goals (Current goals can be found in the Care Plan section)  Acute Rehab PT Goals Patient Stated Goal: Back to PLOF PT Goal Formulation: With patient/family Time For Goal Achievement: 11/13/21 Potential to Achieve Goals: Good    Frequency Min 2X/week     Co-evaluation               AM-PAC PT "6 Clicks" Mobility  Outcome Measure Help needed turning from your back to your side while in a flat bed without using bedrails?: A Little Help needed moving from lying on your back to sitting on the side of a flat bed without using bedrails?: A Little Help needed moving to and from a bed to a chair (including a wheelchair)?: A Little Help needed standing up from a chair using your arms (e.g., wheelchair or bedside chair)?: A Little Help needed to walk in hospital room?: A Little Help needed climbing 3-5 steps with a railing? : A Little 6 Click Score: 18    End of Session Equipment Utilized During Treatment: Gait belt Activity Tolerance: Patient tolerated treatment well Patient left: Other (comment) (Sitting at sink with OT present) Nurse Communication: Mobility status  PT Visit Diagnosis: Unsteadiness on feet (R26.81);Other abnormalities of gait and mobility (R26.89)    Time: 0940-1006 PT Time Calculation (min) (ACUTE ONLY): 26 min   Charges:   PT Evaluation $PT Eval Moderate Complexity: 1 Mod PT Treatments $Gait Training: 8-22 mins        Rolinda Roan, PT, DPT Acute Rehabilitation Services Secure Chat  Preferred Office: [phone]   Thelma Comp 11/06/2021, 11:45 AM

## 2021-11-06 NOTE — TOC Initial Note (Signed)
Transition of Care [facility]) - Initial/Assessment Note    Patient Details  Name: Carol Green MRN: [id number] Date of Birth: 05-10-1950  Transition of [facility]) CM/SW Contact:    Pollie Friar, RN Phone Number: 11/06/2021, 12:40 PM  Clinical Narrative:                 Pt is from home with her spouse. Spouse is active with [facility] services and family asked to use the same for the patient. HH arranged through [facility]. Information on the AVS.  Per family they have all needed DME at home.  Pt manages her own medications and denies any issues.  No transportation issues and family will transport home when discharged.   Expected Discharge Plan: [place] Barriers to Discharge: Continued Medical Work up   Patient Goals and CMS Choice   CMS Medicare.gov Compare Post Acute Care list provided to:: Patient Choice offered to / list presented to : Patient, Adult Children  Expected Discharge Plan and Services Expected Discharge Plan: [place]   Discharge Planning Services: CM Consult Post Acute Care Choice: [place] arrangements for the past 2 months: Single Family Home                           HH Arranged: PT, OT HH Agency: [place] Date [place]: 11/06/21   Representative spoke with at [place]: Malachy Mood  Prior Living Arrangements/Services Living arrangements for the past 2 months: [place] Lives with:: Spouse Patient language and need for interpreter reviewed:: Yes Do you feel safe going back to the place where you live?: Yes      Need for Family Participation in Patient Care: Yes (Comment) Care giver support system in place?: Yes (comment) Current home services: DME (all DME) Criminal Activity/Legal Involvement Pertinent to Current Situation/Hospitalization: No - Comment as needed  Activities of Daily Living      Permission Sought/Granted                  Emotional  Assessment Appearance:: Appears stated age Attitude/Demeanor/Rapport: Engaged Affect (typically observed): Accepting Orientation: : Oriented to Self, Oriented to Place, Oriented to  Time, Oriented to Situation   Psych Involvement: No (comment)  Admission diagnosis:  Fatigue [R53.83] Patient Active Problem List   Diagnosis Date Noted   Fatigue 11/05/2021   Obese 08/02/2019   Left knee OA 08/01/2019   S/P left TKA 08/01/2019   DDD (degenerative disc disease), cervical 01/28/2017   DDD (degenerative disc disease), lumbar 01/28/2017   Primary osteoarthritis of both hands 01/28/2017   Multiple sclerosis ([place]) 01/28/2017   History of hypertension 01/28/2017   History of gastroesophageal reflux (GERD) 01/28/2017   History of diverticulosis 01/28/2017   History of cholecystectomy 01/28/2017   History of appendectomy 01/28/2017   Chronic cystitis 01/28/2017   Piriformis syndrome of right side 01/28/2017   Olecranon bursitis of right elbow 01/28/2017   Varicose veins of lower extremities with other complications 30/16/0109   PCP:  Mateo Flow, MD Pharmacy:   [facility] [place], [place] AT [place] [address] [place] [address] Phone: [phone] Fax: [phone]     Social Determinants of Health (SDOH) Interventions    Readmission Risk Interventions     No data to display

## 2021-11-06 NOTE — Care Management Obs Status (Signed)
[place] NOTIFICATION   Patient Details  Name: Carol Green MRN: [id number] Date of Birth: [birth year]   Medicare Observation Status Notification Given:  Yes    Laurena Slimmer, RN 11/06/2021, 6:20 PM

## 2021-11-06 NOTE — Evaluation (Signed)
Occupational Therapy Evaluation Patient Details Name: Carol Green MRN: [id number] DOB: [birth year] Today's Date: 11/06/2021   History of Present Illness Pt is a 71 y/o female who presents with tremor and decreased mobility/AMS. Suspected UTI. MRI negative for acute changes or MS flare. PMH significant for MS,alopecia, diverticulosis, HTN, chronic cystitis, L THA, B shoulder surgery.   Clinical Impression   PTA patient independent with ADLs, mobility and limited iADls. Admitted for above and presents with problem list below, including impaired balance, decreased activity tolerance, mild L sided weakness.  Handoff from PT during session, engaged in grooming at sink with min guard, transfers with min guard and mobility in room with min guard to min assist with cueing to avoid obstacles on L side.  Patients vision appears WFL, but some L inattention noted as described.  Patient reports having good support from family at dc, will benefit from further OT services acutely and after dc at [facility] level to optimize independence, safety and return to PLOF.      Recommendations for follow up therapy are one component of a multi-disciplinary discharge planning process, led by the attending physician.  Recommendations may be updated based on patient status, additional functional criteria and insurance authorization.   Follow Up Recommendations  Home health OT    Assistance Recommended at Discharge Frequent or constant Supervision/Assistance  Patient can return home with the following A little help with walking and/or transfers;A little help with bathing/dressing/bathroom;Assistance with cooking/housework;Assist for transportation;Help with stairs or ramp for entrance    Functional Status Assessment  Patient has had a recent decline in their functional status and demonstrates the ability to make significant improvements in function in a reasonable and predictable amount of time.  Equipment Recommendations   None recommended by OT    Recommendations for Other Services       Precautions / Restrictions Precautions Precautions: Fall Restrictions Weight Bearing Restrictions: No      Mobility Bed Mobility               General bed mobility comments: from PT sitting at sink    Transfers Overall transfer level: Needs assistance Equipment used: Rolling walker (2 wheels) Transfers: Sit to/from Stand Sit to Stand: Min guard                  Balance Overall balance assessment: Needs assistance Sitting-balance support: No upper extremity supported, Feet supported Sitting balance-Leahy Scale: Fair     Standing balance support: No upper extremity supported, During functional activity, Single extremity supported, Bilateral upper extremity supported Standing balance-Leahy Scale: Poor Standing balance comment: UE support dynamically, min guard at sink 0 hand support during ADLs                           ADL either performed or assessed with clinical judgement   ADL Overall ADL's : Needs assistance/impaired     Grooming: Min guard;Wash/dry face;Oral care;Standing           Upper Body Dressing : Set up;Sitting   Lower Body Dressing: Min guard;Sit to/from stand   Toilet Transfer: Ambulation;Minimal assistance;Rolling walker (2 wheels) Toilet Transfer Details (indicate cue type and reason): simulated in room         Functional mobility during ADLs: Minimal assistance;Rolling walker (2 wheels)       Vision Baseline Vision/History: 1 Wears glasses (reading) Ability to See in Adequate Light: 0 Adequate Patient Visual Report: No change from baseline Vision Assessment?:  Yes Eye Alignment: Within Functional Limits Ocular Range of Motion: Within Functional Limits Alignment/Gaze Preference: Within Defined Limits Tracking/Visual Pursuits: Able to track stimulus in all quads without difficulty Convergence: Within functional limits Visual Fields: No apparent  deficits Additional Comments: pt bumping into objects on L side in room, able to locate needs at sink on L side without cueing. continue assessment     Perception Perception Comments: mild L inattention   Praxis      Pertinent Vitals/Pain Pain Assessment Pain Assessment: Faces Faces Pain Scale: No hurt Pain Intervention(s): Monitored during session     Hand Dominance Right   Extremity/Trunk Assessment Upper Extremity Assessment Upper Extremity Assessment: LUE deficits/detail LUE Deficits / Details: Generally weaker than RUE in shoulder flexion, biceps, triceps. Grossly 4/5 to 4-/5 LUE Sensation: history of peripheral neuropathy LUE Coordination: WNL   Lower Extremity Assessment Lower Extremity Assessment: Defer to PT evaluation LLE Deficits / Details: Quads, hamstrings, hip flexors, ankle DF mildly weaker than RLE. Grossly 4/5 to 4+/5 LLE Sensation: history of peripheral neuropathy   Cervical / Trunk Assessment Cervical / Trunk Assessment: Other exceptions Cervical / Trunk Exceptions: Forward head posture with rounded shoulders   Communication Communication Communication: No difficulties   Cognition Arousal/Alertness: Awake/alert Behavior During Therapy: WFL for tasks assessed/performed Overall Cognitive Status: Impaired/Different from baseline Area of Impairment: Problem solving, Awareness                           Awareness: Emergent Problem Solving: Slow processing, Requires verbal cues General Comments: slow processing, anticipate near baseline     General Comments  family present and supportive    Exercises     Shoulder Instructions      Home Living Family/patient expects to be discharged to:: Private residence Living Arrangements: Spouse/significant other Available Help at Discharge: Family;Available 24 hours/day Type of Home: House Home Access: Stairs to enter [employer] of Steps: threshold step   Home Layout: Two level;Able  to live on main level with bedroom/bathroom     Bathroom Shower/Tub: Occupational psychologist: Handicapped height     Home Equipment: Dolliver (4 wheels);Rolling Walker (2 wheels);Cane - single point;Grab bars - tub/shower          Prior Functioning/Environment Prior Level of Function : Independent/Modified Independent;Driving;History of Falls (last six months)             Mobility Comments: 2 falls in last 6 months          OT Problem List: Decreased strength;Decreased activity tolerance;Impaired balance (sitting and/or standing);Decreased safety awareness;Decreased knowledge of use of DME or AE;Decreased knowledge of precautions      OT Treatment/Interventions: Self-care/ADL training;Neuromuscular education;DME and/or AE instruction;Therapeutic activities;Patient/family education;Balance training    OT Goals(Current goals can be found in the care plan section) Acute Rehab OT Goals Patient Stated Goal: home OT Goal Formulation: With patient Time For Goal Achievement: 11/20/21 Potential to Achieve Goals: Good  OT Frequency: Min 2X/week    Co-evaluation              AM-PAC OT "6 Clicks" Daily Activity     Outcome Measure Help from another person eating meals?: A Little Help from another person taking care of personal grooming?: A Little Help from another person toileting, which includes using toliet, bedpan, or urinal?: A Little Help from another person bathing (including washing, rinsing, drying)?: A Little Help from another person to put on and  taking off regular upper body clothing?: A Little Help from another person to put on and taking off regular lower body clothing?: A Little 6 Click Score: 18   End of Session Equipment Utilized During Treatment: Gait belt;Rolling walker (2 wheels) Nurse Communication: Mobility status  Activity Tolerance: Patient tolerated treatment well Patient left: in chair;with call bell/phone within  reach;with chair alarm set;with family/visitor present  OT Visit Diagnosis: Other abnormalities of gait and mobility (R26.89);Muscle weakness (generalized) (M62.81);History of falling (Z91.81)                Time: 7867-5449 OT Time Calculation (min): 33 min Charges:  OT General Charges $OT Visit: 1 Visit OT Evaluation $OT Eval Moderate Complexity: 1 Mod OT Treatments $Self Care/Home Management : 8-22 mins  Jolaine Artist, OT Acute Rehabilitation Services Office [phone]   Delight Stare 11/06/2021, 12:26 PM

## 2021-11-12 DIAGNOSIS — Z6833 Body mass index (BMI) 33.0-33.9, adult: Secondary | ICD-10-CM | POA: Diagnosis not present

## 2021-11-12 DIAGNOSIS — R252 Cramp and spasm: Secondary | ICD-10-CM | POA: Diagnosis not present

## 2021-11-12 DIAGNOSIS — G35 Multiple sclerosis: Secondary | ICD-10-CM | POA: Diagnosis not present

## 2021-11-12 DIAGNOSIS — F4322 Adjustment disorder with anxiety: Secondary | ICD-10-CM | POA: Diagnosis not present

## 2021-11-17 DIAGNOSIS — I10 Essential (primary) hypertension: Secondary | ICD-10-CM | POA: Diagnosis not present

## 2021-11-17 DIAGNOSIS — G35 Multiple sclerosis: Secondary | ICD-10-CM | POA: Diagnosis not present

## 2021-11-17 DIAGNOSIS — E782 Mixed hyperlipidemia: Secondary | ICD-10-CM | POA: Diagnosis not present

## 2021-11-17 DIAGNOSIS — M1990 Unspecified osteoarthritis, unspecified site: Secondary | ICD-10-CM | POA: Diagnosis not present

## 2021-11-21 DIAGNOSIS — M1990 Unspecified osteoarthritis, unspecified site: Secondary | ICD-10-CM | POA: Diagnosis not present

## 2021-11-21 DIAGNOSIS — E782 Mixed hyperlipidemia: Secondary | ICD-10-CM | POA: Diagnosis not present

## 2021-11-21 DIAGNOSIS — I10 Essential (primary) hypertension: Secondary | ICD-10-CM | POA: Diagnosis not present

## 2021-11-21 DIAGNOSIS — G35 Multiple sclerosis: Secondary | ICD-10-CM | POA: Diagnosis not present

## 2021-11-24 DIAGNOSIS — M1990 Unspecified osteoarthritis, unspecified site: Secondary | ICD-10-CM | POA: Diagnosis not present

## 2021-11-24 DIAGNOSIS — I10 Essential (primary) hypertension: Secondary | ICD-10-CM | POA: Diagnosis not present

## 2021-11-24 DIAGNOSIS — G35 Multiple sclerosis: Secondary | ICD-10-CM | POA: Diagnosis not present

## 2021-11-24 DIAGNOSIS — E782 Mixed hyperlipidemia: Secondary | ICD-10-CM | POA: Diagnosis not present

## 2021-11-26 DIAGNOSIS — Z23 Encounter for immunization: Secondary | ICD-10-CM | POA: Diagnosis not present

## 2021-11-27 DIAGNOSIS — E782 Mixed hyperlipidemia: Secondary | ICD-10-CM | POA: Diagnosis not present

## 2021-11-27 DIAGNOSIS — M1990 Unspecified osteoarthritis, unspecified site: Secondary | ICD-10-CM | POA: Diagnosis not present

## 2021-11-27 DIAGNOSIS — G35 Multiple sclerosis: Secondary | ICD-10-CM | POA: Diagnosis not present

## 2021-11-27 DIAGNOSIS — I10 Essential (primary) hypertension: Secondary | ICD-10-CM | POA: Diagnosis not present

## 2021-12-02 DIAGNOSIS — N390 Urinary tract infection, site not specified: Secondary | ICD-10-CM | POA: Diagnosis not present

## 2021-12-02 DIAGNOSIS — R252 Cramp and spasm: Secondary | ICD-10-CM | POA: Diagnosis not present

## 2021-12-02 DIAGNOSIS — G35 Multiple sclerosis: Secondary | ICD-10-CM | POA: Diagnosis not present

## 2021-12-08 DIAGNOSIS — K219 Gastro-esophageal reflux disease without esophagitis: Secondary | ICD-10-CM | POA: Diagnosis not present

## 2021-12-08 DIAGNOSIS — G35 Multiple sclerosis: Secondary | ICD-10-CM | POA: Diagnosis not present

## 2021-12-08 DIAGNOSIS — E782 Mixed hyperlipidemia: Secondary | ICD-10-CM | POA: Diagnosis not present

## 2021-12-08 DIAGNOSIS — N390 Urinary tract infection, site not specified: Secondary | ICD-10-CM | POA: Diagnosis not present

## 2021-12-08 DIAGNOSIS — K5790 Diverticulosis of intestine, part unspecified, without perforation or abscess without bleeding: Secondary | ICD-10-CM | POA: Diagnosis not present

## 2021-12-08 DIAGNOSIS — M1990 Unspecified osteoarthritis, unspecified site: Secondary | ICD-10-CM | POA: Diagnosis not present

## 2021-12-08 DIAGNOSIS — I10 Essential (primary) hypertension: Secondary | ICD-10-CM | POA: Diagnosis not present

## 2021-12-08 DIAGNOSIS — F4322 Adjustment disorder with anxiety: Secondary | ICD-10-CM | POA: Diagnosis not present

## 2021-12-10 DIAGNOSIS — N390 Urinary tract infection, site not specified: Secondary | ICD-10-CM | POA: Diagnosis not present

## 2021-12-23 DIAGNOSIS — M1990 Unspecified osteoarthritis, unspecified site: Secondary | ICD-10-CM | POA: Diagnosis not present

## 2021-12-23 DIAGNOSIS — N390 Urinary tract infection, site not specified: Secondary | ICD-10-CM | POA: Diagnosis not present

## 2021-12-23 DIAGNOSIS — K5790 Diverticulosis of intestine, part unspecified, without perforation or abscess without bleeding: Secondary | ICD-10-CM | POA: Diagnosis not present

## 2021-12-23 DIAGNOSIS — F4322 Adjustment disorder with anxiety: Secondary | ICD-10-CM | POA: Diagnosis not present

## 2021-12-23 DIAGNOSIS — G35 Multiple sclerosis: Secondary | ICD-10-CM | POA: Diagnosis not present

## 2021-12-23 DIAGNOSIS — I10 Essential (primary) hypertension: Secondary | ICD-10-CM | POA: Diagnosis not present

## 2021-12-23 DIAGNOSIS — K219 Gastro-esophageal reflux disease without esophagitis: Secondary | ICD-10-CM | POA: Diagnosis not present

## 2021-12-23 DIAGNOSIS — E782 Mixed hyperlipidemia: Secondary | ICD-10-CM | POA: Diagnosis not present

## 2021-12-30 DIAGNOSIS — N3020 Other chronic cystitis without hematuria: Secondary | ICD-10-CM | POA: Diagnosis not present

## 2021-12-30 DIAGNOSIS — R252 Cramp and spasm: Secondary | ICD-10-CM | POA: Diagnosis not present

## 2021-12-30 DIAGNOSIS — Z6834 Body mass index (BMI) 34.0-34.9, adult: Secondary | ICD-10-CM | POA: Diagnosis not present

## 2022-01-01 DIAGNOSIS — R198 Other specified symptoms and signs involving the digestive system and abdomen: Secondary | ICD-10-CM | POA: Diagnosis not present

## 2022-01-01 DIAGNOSIS — R1312 Dysphagia, oropharyngeal phase: Secondary | ICD-10-CM | POA: Diagnosis not present

## 2022-01-01 DIAGNOSIS — Z86010 Personal history of colonic polyps: Secondary | ICD-10-CM | POA: Diagnosis not present

## 2022-01-01 DIAGNOSIS — K222 Esophageal obstruction: Secondary | ICD-10-CM | POA: Diagnosis not present

## 2022-01-02 DIAGNOSIS — N390 Urinary tract infection, site not specified: Secondary | ICD-10-CM | POA: Diagnosis not present

## 2022-01-02 DIAGNOSIS — F4322 Adjustment disorder with anxiety: Secondary | ICD-10-CM | POA: Diagnosis not present

## 2022-01-02 DIAGNOSIS — M1990 Unspecified osteoarthritis, unspecified site: Secondary | ICD-10-CM | POA: Diagnosis not present

## 2022-01-02 DIAGNOSIS — E782 Mixed hyperlipidemia: Secondary | ICD-10-CM | POA: Diagnosis not present

## 2022-01-02 DIAGNOSIS — K219 Gastro-esophageal reflux disease without esophagitis: Secondary | ICD-10-CM | POA: Diagnosis not present

## 2022-01-02 DIAGNOSIS — G35 Multiple sclerosis: Secondary | ICD-10-CM | POA: Diagnosis not present

## 2022-01-02 DIAGNOSIS — I10 Essential (primary) hypertension: Secondary | ICD-10-CM | POA: Diagnosis not present

## 2022-01-02 DIAGNOSIS — K5790 Diverticulosis of intestine, part unspecified, without perforation or abscess without bleeding: Secondary | ICD-10-CM | POA: Diagnosis not present

## 2022-01-05 DIAGNOSIS — K5790 Diverticulosis of intestine, part unspecified, without perforation or abscess without bleeding: Secondary | ICD-10-CM | POA: Diagnosis not present

## 2022-01-05 DIAGNOSIS — N390 Urinary tract infection, site not specified: Secondary | ICD-10-CM | POA: Diagnosis not present

## 2022-01-05 DIAGNOSIS — E782 Mixed hyperlipidemia: Secondary | ICD-10-CM | POA: Diagnosis not present

## 2022-01-05 DIAGNOSIS — M1990 Unspecified osteoarthritis, unspecified site: Secondary | ICD-10-CM | POA: Diagnosis not present

## 2022-01-05 DIAGNOSIS — I10 Essential (primary) hypertension: Secondary | ICD-10-CM | POA: Diagnosis not present

## 2022-01-05 DIAGNOSIS — K219 Gastro-esophageal reflux disease without esophagitis: Secondary | ICD-10-CM | POA: Diagnosis not present

## 2022-01-05 DIAGNOSIS — G35 Multiple sclerosis: Secondary | ICD-10-CM | POA: Diagnosis not present

## 2022-01-05 DIAGNOSIS — F4322 Adjustment disorder with anxiety: Secondary | ICD-10-CM | POA: Diagnosis not present

## 2022-01-07 DIAGNOSIS — K2930 Chronic superficial gastritis without bleeding: Secondary | ICD-10-CM | POA: Diagnosis not present

## 2022-01-07 DIAGNOSIS — Z86010 Personal history of colonic polyps: Secondary | ICD-10-CM | POA: Diagnosis not present

## 2022-01-07 DIAGNOSIS — R1310 Dysphagia, unspecified: Secondary | ICD-10-CM | POA: Diagnosis not present

## 2022-01-07 DIAGNOSIS — Z09 Encounter for follow-up examination after completed treatment for conditions other than malignant neoplasm: Secondary | ICD-10-CM | POA: Diagnosis not present

## 2022-01-07 DIAGNOSIS — K5710 Diverticulosis of small intestine without perforation or abscess without bleeding: Secondary | ICD-10-CM | POA: Diagnosis not present

## 2022-01-07 DIAGNOSIS — K224 Dyskinesia of esophagus: Secondary | ICD-10-CM | POA: Diagnosis not present

## 2022-01-07 DIAGNOSIS — K222 Esophageal obstruction: Secondary | ICD-10-CM | POA: Diagnosis not present

## 2022-01-07 DIAGNOSIS — K449 Diaphragmatic hernia without obstruction or gangrene: Secondary | ICD-10-CM | POA: Diagnosis not present

## 2022-01-07 DIAGNOSIS — K635 Polyp of colon: Secondary | ICD-10-CM | POA: Diagnosis not present

## 2022-01-07 DIAGNOSIS — K2970 Gastritis, unspecified, without bleeding: Secondary | ICD-10-CM | POA: Diagnosis not present

## 2022-01-07 DIAGNOSIS — K5730 Diverticulosis of large intestine without perforation or abscess without bleeding: Secondary | ICD-10-CM | POA: Diagnosis not present

## 2022-01-07 DIAGNOSIS — K269 Duodenal ulcer, unspecified as acute or chronic, without hemorrhage or perforation: Secondary | ICD-10-CM | POA: Diagnosis not present

## 2022-01-12 DIAGNOSIS — M1990 Unspecified osteoarthritis, unspecified site: Secondary | ICD-10-CM | POA: Diagnosis not present

## 2022-01-12 DIAGNOSIS — F4322 Adjustment disorder with anxiety: Secondary | ICD-10-CM | POA: Diagnosis not present

## 2022-01-12 DIAGNOSIS — N390 Urinary tract infection, site not specified: Secondary | ICD-10-CM | POA: Diagnosis not present

## 2022-01-12 DIAGNOSIS — K219 Gastro-esophageal reflux disease without esophagitis: Secondary | ICD-10-CM | POA: Diagnosis not present

## 2022-01-12 DIAGNOSIS — I10 Essential (primary) hypertension: Secondary | ICD-10-CM | POA: Diagnosis not present

## 2022-01-12 DIAGNOSIS — E782 Mixed hyperlipidemia: Secondary | ICD-10-CM | POA: Diagnosis not present

## 2022-01-12 DIAGNOSIS — G35 Multiple sclerosis: Secondary | ICD-10-CM | POA: Diagnosis not present

## 2022-01-12 DIAGNOSIS — K5790 Diverticulosis of intestine, part unspecified, without perforation or abscess without bleeding: Secondary | ICD-10-CM | POA: Diagnosis not present

## 2022-01-14 DIAGNOSIS — K2930 Chronic superficial gastritis without bleeding: Secondary | ICD-10-CM | POA: Diagnosis not present

## 2022-01-14 DIAGNOSIS — K635 Polyp of colon: Secondary | ICD-10-CM | POA: Diagnosis not present

## 2022-03-03 DIAGNOSIS — H04123 Dry eye syndrome of bilateral lacrimal glands: Secondary | ICD-10-CM | POA: Diagnosis not present

## 2022-03-03 DIAGNOSIS — Z961 Presence of intraocular lens: Secondary | ICD-10-CM | POA: Diagnosis not present

## 2022-03-03 DIAGNOSIS — G35 Multiple sclerosis: Secondary | ICD-10-CM | POA: Diagnosis not present

## 2022-03-03 DIAGNOSIS — H40013 Open angle with borderline findings, low risk, bilateral: Secondary | ICD-10-CM | POA: Diagnosis not present

## 2022-03-13 DIAGNOSIS — Z79899 Other long term (current) drug therapy: Secondary | ICD-10-CM | POA: Diagnosis not present

## 2022-03-13 DIAGNOSIS — G35 Multiple sclerosis: Secondary | ICD-10-CM | POA: Diagnosis not present

## 2022-03-13 DIAGNOSIS — Z885 Allergy status to narcotic agent status: Secondary | ICD-10-CM | POA: Diagnosis not present

## 2022-03-26 DIAGNOSIS — B372 Candidiasis of skin and nail: Secondary | ICD-10-CM | POA: Diagnosis not present

## 2022-03-26 DIAGNOSIS — N3020 Other chronic cystitis without hematuria: Secondary | ICD-10-CM | POA: Diagnosis not present

## 2022-03-26 DIAGNOSIS — Z6834 Body mass index (BMI) 34.0-34.9, adult: Secondary | ICD-10-CM | POA: Diagnosis not present

## 2022-03-26 DIAGNOSIS — B351 Tinea unguium: Secondary | ICD-10-CM | POA: Diagnosis not present

## 2022-04-03 DIAGNOSIS — B351 Tinea unguium: Secondary | ICD-10-CM | POA: Diagnosis not present

## 2022-04-03 DIAGNOSIS — N3020 Other chronic cystitis without hematuria: Secondary | ICD-10-CM | POA: Diagnosis not present

## 2022-04-07 ENCOUNTER — Telehealth: Payer: Self-pay | Admitting: Family Medicine

## 2022-04-07 NOTE — Telephone Encounter (Signed)
error 

## 2022-04-13 ENCOUNTER — Ambulatory Visit: Payer: Medicare Other | Admitting: Neurology

## 2022-04-13 ENCOUNTER — Encounter: Payer: Self-pay | Admitting: Neurology

## 2022-04-13 VITALS — BP 134/76 | HR 64 | Ht 65.0 in | Wt 216.5 lb

## 2022-04-13 DIAGNOSIS — R269 Unspecified abnormalities of gait and mobility: Secondary | ICD-10-CM | POA: Diagnosis not present

## 2022-04-13 DIAGNOSIS — R5383 Other fatigue: Secondary | ICD-10-CM

## 2022-04-13 DIAGNOSIS — Z79899 Other long term (current) drug therapy: Secondary | ICD-10-CM

## 2022-04-13 DIAGNOSIS — N3941 Urge incontinence: Secondary | ICD-10-CM | POA: Diagnosis not present

## 2022-04-13 DIAGNOSIS — E559 Vitamin D deficiency, unspecified: Secondary | ICD-10-CM | POA: Diagnosis not present

## 2022-04-13 DIAGNOSIS — R159 Full incontinence of feces: Secondary | ICD-10-CM

## 2022-04-13 DIAGNOSIS — G35 Multiple sclerosis: Secondary | ICD-10-CM | POA: Diagnosis not present

## 2022-04-13 DIAGNOSIS — G35D Multiple sclerosis, unspecified: Secondary | ICD-10-CM

## 2022-04-13 NOTE — Progress Notes (Signed)
[facility]  PATIENT: Carol Green DOB: [birth year]  REFERRING DOCTOR OR PCP: Bertram Millard, MD; Ala Bent MD (neurology SOURCE: Patient, note from [facility] neurology, imaging and lab reports, multiple MRI images personally reviewed.  _________________________________   HISTORICAL  CHIEF COMPLAINT:  Chief Complaint  Patient presents with   New Patient (Initial Visit)    Pt in room 11, new patient here for MS. Paper referral. Pt fell at home last Monday. Pain in left knee, history of knee surgery. Hospital in Oct, for tremors, had tremors last Monday with fall.    HISTORY OF PRESENT ILLNESS:  I had the pleasure of seeing your patient, Carol Green, at [facility] for neurologic consultation regarding her diagnosis of multiple sclerosis.  She is a 72 year old woman who was diagnosed in 17.  At that time she had the acute onset of numbness and weakness in the left arm and leg x 3 da andys.   She was admitted to the hospital [facility]).   MRI of the brain and spine was reportedly consistent with MS.  She also had an LP.   She was told the MRI and spinal fluid were c/w MS.   She received a few days of IV Solu-Medrol.  She believes the cervical spine MRI was normal.    The oldest MRI we have is from 2009 and shows nonspecific white matter changes in the brain and pons.  Unfortunately I do not have any of the spine MRIs that might better explain her symptoms at that time.  She was diagnosed by Dr. Dellis Filbert and placed on Betaseron in 1996.  At some point she was switched to Archie around 2011 to avoid needles.  In February/March 2023 she switched to [place].      Initially, she was seeing Dr. Delphia Grates and followed him from [facility] to the [place] area.  Upon his death she saw her Dr. Ala Bent at [facility].     She had a bad UTI and was admitted to [facility] (transferred to [facility])  with tremors   She would get intermittent shakes since then.   She did PT x 8 weeks afterwards in her home.      Currently, she uses a cane for balance and left knee pain.   She rarely uses stairs but notes difficulty going up a couple steps and fell once in her house where there is a single step.   She needs to hold the bannister tightly with stairs (and has done so x many years).   She notes her left knee seems weaker than her right, even before the knee surgery.    Arm strength is fine.    She has mild numbness and tingling left > right.   Vision is fine (she sees [facility] since starting Gilenya)  She has glaucoma but does not have ME or changes c/w ON.   She has urinary urgency/frequency and has incontinence.  [place]   Sometimes she has hesitancy (moreso the last few months).   She has bowel urgency and incontinence.   Incontinence has been preset x years.  She wears Depends.  She has some tremors -- these greatly intensified when she had a UTI last October.  She notes the milder tremor is worse with intention (holding utensils or a cup but not writing).   They are not worsened by mood or nervousness.    She fell after bending over and went down not hurting herself.  She notes the leg gave out and she has noted that happens sometimes when the left knee hurts.   EMT was called to help her stand up but she did not go to the ED.  BP was fine.      She denies depression or anxiety.      She sleeps well with Tylenol PM.    She has more trouble falling asleep than staying asleep.    She reports a yeast skin infection under her breast.    Imaging: MRI of the brain 11/05/2021 showed scattered T2/FLAIR hyperintense foci in the subcortical and deep white matter with just a couple of the periventricular white matter.  Foci were also noted in the left anterior midbrain and pons.  Compared to the MRI from 12/08/2018, there is minimal increase in size of some of the subcortical/deep white matter foci but no definite new lesion.  The MRI of the brain from 2009 also shows  that focus in the midbrain, and some foci in the pons and hemispheres, mostly nonspecific though a couple or periventricular and 1 is juxtacortical  No cervical spine results were available.      REVIEW OF SYSTEMS: Constitutional: No fevers, chills, sweats, or change in appetite Eyes: No visual changes, double vision, eye pain Ear, nose and throat: No hearing loss, ear pain, nasal congestion, sore throat Cardiovascular: No chest pain, palpitations Respiratory:  No shortness of breath at rest or with exertion.   No wheezes GastrointestinaI: No nausea, vomiting, diarrhea, abdominal pain, fecal incontinence Genitourinary:  No dysuria, urinary retention or frequency.  No nocturia. Musculoskeletal:  No neck pain, back pain Integumentary: No rash, pruritus, skin lesions Neurological: as above Psychiatric: No depression at this time.  No anxiety Endocrine: No palpitations, diaphoresis, change in appetite, change in weigh or increased thirst Hematologic/Lymphatic:  No anemia, purpura, petechiae. Allergic/Immunologic: No itchy/runny eyes, nasal congestion, recent allergic reactions, rashes  ALLERGIES: Allergies  Allergen Reactions   Codeine Anaphylaxis and Other (See Comments)   Latex     Other reaction(s): Other (See Comments)    Tape Rash    Pt can use paper tape    HOME MEDICATIONS:  Current Outpatient Medications:    acetaminophen (TYLENOL) 500 MG tablet, Take 2 tablets (1,000 mg total) by mouth every 8 (eight) hours., Disp: 30 tablet, Rfl: 0   atorvastatin (LIPITOR) 40 MG tablet, Take 40 mg by mouth at bedtime., Disp: , Rfl:    baclofen (LIORESAL) 10 MG tablet, Take 10 mg by mouth 3 (three) times daily as needed for muscle spasms., Disp: , Rfl:    CRANBERRY PO, Take 2 tablets by mouth daily at 12 noon., Disp: , Rfl:    gabapentin (NEURONTIN) 600 MG tablet, Take 600 mg by mouth in the morning, at noon, in the evening, and at bedtime., Disp: , Rfl:    GEMTESA 75 MG TABS, Take 1  tablet by mouth at bedtime., Disp: , Rfl:    lisinopril-hydrochlorothiazide (ZESTORETIC) 20-12.5 MG tablet, Take 1 tablet by mouth daily., Disp: , Rfl:    meloxicam (MOBIC) 15 MG tablet, Take 15 mg by mouth daily as needed for pain., Disp: , Rfl:    Multiple Vitamin (MULTIVITAMIN WITH MINERALS) TABS tablet, Take 1 tablet by mouth daily at 12 noon., Disp: , Rfl:    Multiple Vitamins-Minerals (HAIR/SKIN/NAILS/BIOTIN PO), Take 1 tablet by mouth daily with supper., Disp: , Rfl:    pantoprazole (PROTONIX) 40 MG tablet, Take 40 mg by mouth 2 (two) times daily. Before  breakfast & before supper, Disp: , Rfl:    ALPRAZolam (XANAX) 0.5 MG tablet, Take 0.5 mg by mouth 2 (two) times daily as needed for anxiety. (Patient not taking: Reported on 04/13/2022), Disp: , Rfl:    aspirin EC 81 MG tablet, Take 162 mg by mouth daily. Swallow whole. (Patient not taking: Reported on 04/13/2022), Disp: , Rfl:    DULoxetine (CYMBALTA) 20 MG capsule, Take 1 capsule (20 mg total) by mouth daily. (Patient not taking: Reported on 04/13/2022), Disp: 30 capsule, Rfl: 2   ocrelizumab 300 mg in sodium chloride 0.9 % 250 mL, Inject 300 mg into the vein every 6 (six) months. (Patient not taking: Reported on 04/13/2022), Disp: , Rfl:    Propylene Glycol (SYSTANE BALANCE) 0.6 % SOLN, Place 1 drop into both eyes daily as needed (dry eyes). (Patient not taking: Reported on 04/13/2022), Disp: , Rfl:    valACYclovir (VALTREX) 1000 MG tablet, Take 1,000 mg by mouth daily. (Patient not taking: Reported on 04/13/2022), Disp: , Rfl:   PAST MEDICAL HISTORY: Past Medical History:  Diagnosis Date   Acid reflux    Alopecia    Arthritis    Chronic cystitis    Diverticulosis    Hypertension    Multiple sclerosis ([place])    Varicose veins     PAST SURGICAL HISTORY: Past Surgical History:  Procedure Laterality Date   ABDOMINAL HYSTERECTOMY  1994   CHOLECYSTECTOMY  1987   FRACTURE SURGERY Left 2002   OTHER SURGICAL HISTORY Left 2004   removal  fatty tumor left arm   SHOULDER SURGERY Bilateral 2003 - right; 2008 - left   TOTAL KNEE ARTHROPLASTY Left 08/01/2019   Procedure: TOTAL KNEE ARTHROPLASTY;  Surgeon: Paralee Cancel, MD;  Location: [facility];  Service: Orthopedics;  Laterality: Left;  70 mins   TUBAL LIGATION  1977    FAMILY HISTORY: Family History  Problem Relation Age of Onset   Cancer Sister        breast   Deep vein thrombosis Brother    Other Brother        varicose veins   Diabetes Sister    Deep vein thrombosis Sister    Hypertension Sister    Hyperlipidemia Sister    Other Sister        varicose veins   Breast cancer Sister    Cancer Mother        ovarian   Other Father        varicose veins   Deep vein thrombosis Father     SOCIAL HISTORY: Social History   Socioeconomic History   Marital status: Married    Spouse name: Not on file   Number of children: Not on file   Years of education: Not on file   Highest education level: Not on file  Occupational History   Not on file  Tobacco Use   Smoking status: Never   Smokeless tobacco: Never  Vaping Use   Vaping Use: Never used  Substance and Sexual Activity   Alcohol use: No   Drug use: No   Sexual activity: Not on file  Other Topics Concern   Not on file  Social History Narrative   Right handed    Wear reader glasses only    Does not drink caffeine    Social Determinants of Health   Financial Resource Strain: Not on file  Food Insecurity: Not on file  Transportation Needs: Not on file  Physical Activity: Not on file  Stress: Not  on file  Social Connections: Not on file  Intimate Partner Violence: Not on file       PHYSICAL EXAM  Vitals:   04/13/22 0848  BP: 134/76  Pulse: 64  Weight: 216 lb 8 oz (98.2 kg)  Height: '5\' 5"'$  (1.651 m)    Body mass index is 36.03 kg/m.   General: The patient is well-developed and well-nourished and in no acute distress  HEENT:  Head is [place]/AT.  Sclera are anicteric.  Funduscopic exam shows  normal optic discs and retinal vessels.  Neck: No carotid bruits are noted.  The neck is nontender.  Cardiovascular: The heart has a regular rate and rhythm with a normal S1 and S2. There were no murmurs, gallops or rubs.    Skin: Extremities are without rash or  edema.  Musculoskeletal:  Back is nontender  Neurologic Exam  Mental status: The patient is alert and oriented x 3 at the time of the examination. The patient has apparent normal recent and remote memory, with an apparently normal attention span and concentration ability.   Speech is normal.  Cranial nerves: Extraocular movements are full.  Color vision was symmetric..  Facial symmetry is present. There is good facial sensation to soft touch bilaterally.Facial strength is normal.  Trapezius and sternocleidomastoid strength is normal. No dysarthria is noted.  The tongue is midline, and the patient has symmetric elevation of the soft palate. No obvious hearing deficits are noted.  Motor:  Muscle bulk is normal.   Tone is normal. Strength is  5 / 5 in all 4 extremities.   Sensory: Sensory testing is intact to pinprick, soft touch and vibration sensation in arms and good touch sensation in feet.  Slight reduced vibration in right foot relative to left.  .  Coordination: Cerebellar testing reveals good finger-nose-finger and heel-to-shin bilaterally.  Gait and station: Station is normal.   Gait is arthritic and mildly wide.   Tandem is poor/wide..  Romberg is negative.   Reflexes: Deep tendon reflexes are symmetric and normal bilaterally.   Plantar responses are flexor.    DIAGNOSTIC DATA (LABS, IMAGING, TESTING) - I reviewed patient records, labs, notes, testing and imaging myself where available.  Lab Results  Component Value Date   WBC 15.0 (H) 11/05/2021   HGB 12.0 11/05/2021   HCT 37.0 11/05/2021   MCV 95.9 11/05/2021   PLT 191 11/05/2021      Component Value Date/Time   NA 143 11/05/2021 2212   K 4.0 11/05/2021  2212   CL 109 11/05/2021 2212   CO2 24 11/05/2021 2212   GLUCOSE 130 (H) 11/05/2021 2212   BUN 20 11/05/2021 2212   CREATININE 0.91 11/05/2021 2212   CALCIUM 9.5 11/05/2021 2212   PROT 5.8 (L) 11/05/2021 2212   ALBUMIN 3.4 (L) 11/05/2021 2212   AST 22 11/05/2021 2212   ALT 17 11/05/2021 2212   ALKPHOS 68 11/05/2021 2212   BILITOT 0.5 11/05/2021 2212   GFRNONAA >60 11/05/2021 2212   GFRAA >60 08/02/2019 0247   No results found for: "CHOL", "HDL", "LDLCALC", "LDLDIRECT", "TRIG", "CHOLHDL" Lab Results  Component Value Date   HGBA1C 6.4 (H) 11/05/2021   No results found for: "VITAMINB12" No results found for: "TSH"     ASSESSMENT AND PLAN  Multiple sclerosis ([place]) - Plan: CBC with Differential/Platelet, Comprehensive metabolic panel, QuantiFERON-TB Gold Plus, MR CERVICAL SPINE WO CONTRAST  Other fatigue  Gait disturbance - Plan: MR CERVICAL SPINE WO CONTRAST  Urge incontinence of urine -  Plan: MR CERVICAL SPINE WO CONTRAST  Incontinence of feces, unspecified fecal incontinence type  High risk medication use - Plan: CBC with Differential/Platelet, Comprehensive metabolic panel, QuantiFERON-TB Gold Plus  Vitamin D deficiency - Plan: VITAMIN D 25 Hydroxy (Vit-D Deficiency, Fractures)   In summary, Ms. Sylvest is a 72 year old woman diagnosed with multiple sclerosis in 2005.  She has had no recent exacerbation.  She was initially placed on Betaseron and then Gilenya.  More recently she was switched from [place] to [place].  Due to her age, I would be very hesitant to have her continue on either Ocrevus or return to Gilenya due to higher risk of infections.  She does not have aggressive changes on the MRI and many of the foci are likely chronic microvascular ischemic change.  I discussed some options with her and I recommend that she switch to teriflunomide ([place]).  This has a much lower risk of infection.  I do not think she needs the extra efficacy of Ocrevus.  We will check  lab work including a Quantiferon TB test..   If the labs are fine he will begin taking teriflunomide.    I discussed with her that she will need to have liver function/hepatic panel blood work once a month 5 to 6 months.  She lives further away and would prefer to do this through her primary care office.  If this cannot be done, we will set her up for [facility].  She will return to see me in 6 months or sooner if there are new or worsening neurologic symptoms.  Thank you for asking me to see Ms. Grissom.  Please let me know if I can be of further assistance with her or other patients in the future.    Tyiana Hill A. Felecia Shelling, MD, [facility] [id number], [id number] AM Certified in Neurology, Clinical Neurophysiology, Sleep Medicine and Neuroimaging  [facility] [address] [place], [place] [address] [phone]

## 2022-04-16 ENCOUNTER — Telehealth: Payer: Self-pay | Admitting: Neurology

## 2022-04-16 NOTE — Telephone Encounter (Signed)
[employer]BS medicare: Prior Josem Kaufmann is not required for [facility] Medicare members between April 03, 2022, and May 03, 2022. Please check back after 05/03/22 for any future requests sent to GI [phone]

## 2022-04-19 LAB — CBC WITH DIFFERENTIAL/PLATELET
Basophils Absolute: 0 10*3/uL (ref 0.0–0.2)
Basos: 0 %
EOS (ABSOLUTE): 0.1 10*3/uL (ref 0.0–0.4)
Eos: 1 %
Hematocrit: 38.3 % (ref 34.0–46.6)
Hemoglobin: 12.7 g/dL (ref 11.1–15.9)
Immature Grans (Abs): 0 10*3/uL (ref 0.0–0.1)
Immature Granulocytes: 1 %
Lymphocytes Absolute: 0.6 10*3/uL — ABNORMAL LOW (ref 0.7–3.1)
Lymphs: 10 %
MCH: 29.7 pg (ref 26.6–33.0)
MCHC: 33.2 g/dL (ref 31.5–35.7)
MCV: 90 fL (ref 79–97)
Monocytes Absolute: 0.4 10*3/uL (ref 0.1–0.9)
Monocytes: 7 %
Neutrophils Absolute: 4.8 10*3/uL (ref 1.4–7.0)
Neutrophils: 81 %
Platelets: 178 10*3/uL (ref 150–450)
RBC: 4.28 x10E6/uL (ref 3.77–5.28)
RDW: 12.7 % (ref 11.7–15.4)
WBC: 5.9 10*3/uL (ref 3.4–10.8)

## 2022-04-19 LAB — COMPREHENSIVE METABOLIC PANEL
ALT: 13 IU/L (ref 0–32)
AST: 18 IU/L (ref 0–40)
Albumin/Globulin Ratio: 2.6 — ABNORMAL HIGH (ref 1.2–2.2)
Albumin: 4.5 g/dL (ref 3.8–4.8)
Alkaline Phosphatase: 110 IU/L (ref 44–121)
BUN/Creatinine Ratio: 15 (ref 12–28)
BUN: 16 mg/dL (ref 8–27)
Bilirubin Total: 0.4 mg/dL (ref 0.0–1.2)
CO2: 25 mmol/L (ref 20–29)
Calcium: 9.8 mg/dL (ref 8.7–10.3)
Chloride: 106 mmol/L (ref 96–106)
Creatinine, Ser: 1.09 mg/dL — ABNORMAL HIGH (ref 0.57–1.00)
Globulin, Total: 1.7 g/dL (ref 1.5–4.5)
Glucose: 101 mg/dL — ABNORMAL HIGH (ref 70–99)
Potassium: 4.1 mmol/L (ref 3.5–5.2)
Sodium: 145 mmol/L — ABNORMAL HIGH (ref 134–144)
Total Protein: 6.2 g/dL (ref 6.0–8.5)
eGFR: 54 mL/min/{1.73_m2} — ABNORMAL LOW (ref >59)

## 2022-04-19 LAB — QUANTIFERON-TB GOLD PLUS
QuantiFERON Mitogen Value: 5.18 IU/mL
QuantiFERON Nil Value: 0.05 IU/mL
QuantiFERON TB1 Ag Value: 0.08 IU/mL
QuantiFERON TB2 Ag Value: 0.17 IU/mL
QuantiFERON-TB Gold Plus: NEGATIVE

## 2022-04-19 LAB — VITAMIN D 25 HYDROXY (VIT D DEFICIENCY, FRACTURES): Vit D, 25-Hydroxy: 43.8 ng/mL (ref 30.0–100.0)

## 2022-04-20 ENCOUNTER — Telehealth: Payer: Self-pay | Admitting: *Deleted

## 2022-04-20 DIAGNOSIS — G35 Multiple sclerosis: Secondary | ICD-10-CM

## 2022-04-20 NOTE — Telephone Encounter (Signed)
-----   Message from Britt Bottom, MD sent at 04/19/2022  1:34 PM EDT ----- Labs are fine for teriflunomide (please let patient know and we can send into pharmacy)   Let her know if copay high, to let us know and we can send to [facility]

## 2022-04-20 NOTE — Telephone Encounter (Signed)
Called pt at [phone]. LVM for pt to call.

## 2022-04-21 MED ORDER — TERIFLUNOMIDE 14 MG PO TABS: 1.0000 | ORAL_TABLET | Freq: Every day | ORAL | 3 refills | Status: AC

## 2022-04-21 NOTE — Telephone Encounter (Signed)
Tried calling pt again at [phone]. I was able to speak with pt. She is agreeable to discontinue Ocrevus and start on teriflunomide 14mg  tablet. Aware we will send prescription to [facility] to be mailed to her. Provided her their phone number: [phone]. E-scribed rx to them. She will f/u with them if they do not call in a few days. She will also call our office once she receives medication in the mail to schedule monthly lab draws. Aware she will have to do once a month LFT lab for 6 months starting a month after she has been on medication.   Also messaged [facility] via [facility]. Spoke w/ Arlyss Queen and informed her that pt will d/c Ocrevus. She cx infusion for this Friday. Advised pt does not need to call.

## 2022-04-21 NOTE — Addendum Note (Signed)
Addended by: Wyvonnia Lora on: 04/21/2022 09:07 AM   Modules accepted: Orders

## 2022-04-22 NOTE — Telephone Encounter (Signed)
Completed PA for teriflunomide via CMM. Sent to [facility] [employer] [facility]. Should have a determination within 3-5 business days. Key: [id number].

## 2022-04-23 NOTE — Telephone Encounter (Signed)
Received fax from [facility] that [facility] approved until 04/22/23.

## 2022-04-24 ENCOUNTER — Encounter (HOSPITAL_COMMUNITY): Payer: Medicare Other

## 2022-05-01 ENCOUNTER — Telehealth: Payer: Self-pay | Admitting: Neurology

## 2022-05-01 DIAGNOSIS — G35 Multiple sclerosis: Secondary | ICD-10-CM

## 2022-05-01 DIAGNOSIS — G35D Multiple sclerosis, unspecified: Secondary | ICD-10-CM

## 2022-05-01 DIAGNOSIS — Z79899 Other long term (current) drug therapy: Secondary | ICD-10-CM

## 2022-05-01 NOTE — Telephone Encounter (Signed)
Just wanting to confirm. Last Ocrevus infusion 10/24/21. Ok to go ahead and start on teriflunomide 14mg  correct?

## 2022-05-01 NOTE — Telephone Encounter (Signed)
Pt said she was told to call and report when she gets her medication  Teriflunomide 14 MG TABS, pt states she has been informed that it will be delivered on Tues of next week.  Pt is asking when she should start the medication, please call.

## 2022-05-04 NOTE — Telephone Encounter (Signed)
Please call pt and let her know she can start teriflunomide as soon as she receives it per Dr. Felecia Shelling.   Please also set up lab appt as well. She to have once a month lab appt to check liver function starting a month after being on medication. If she receives and starts tomorrow (05/05/2022), then she should schedule first one around 06/04/22. I placed lab orders as well. She then will need additional ones around 07/05/22, 08/04/22, 09/04/22, 10/05/22 (she can complete September labs at f/u on 10/14/22) and 11/04/22.

## 2022-05-04 NOTE — Telephone Encounter (Signed)
Please call pt and give her following appt date/times for lab appt. Not all could be on the 2nd of each month d/t being on the weekend  06/04/22 at 11am 07/06/22 at 11am 08/04/22 at 11am 09/07/22 at 11am 10/14/22 at 11:30am (this is her office visit and she will do labs at this appt also) 11/04/22 at 11am

## 2022-05-04 NOTE — Telephone Encounter (Signed)
Called pt and she stated that the medication will be delivered on 05/05/22 and she would start it right away. I informed pt that she would be put on the lab schedule on the 2nd of every month starting in May for her Liver Function. She agreed on that and stated she will be coming in around 11am on those dates. I also explained to her that on her Sept appt with the Provider she can go ahead and get it done at that time. Pt verbalized understanding and appreciation. Was not able to schedule the pt for all of her appts due to lab template not being done for the month of May and on.

## 2022-05-04 NOTE — Telephone Encounter (Signed)
Placed IT ticket to see if they can help fix lab schedule to allow [place] to schedule pt. Waiting on response.

## 2022-05-04 NOTE — Telephone Encounter (Signed)
Asked Jael to speak with Angie to see if lab schedule can be fixed to be able to schedule pt

## 2022-05-05 ENCOUNTER — Encounter (HOSPITAL_COMMUNITY): Payer: Medicare Other

## 2022-05-05 NOTE — Telephone Encounter (Signed)
It was not completed by the phone staff on 05/04/22. I completed it yesterday. The lab schedule was not available due to it being blocked started 05/15/22 and I had to get this fixed in order to get pt scheduled.  Please call her back and let her know dates/times below that I scheduled her for. Thank you

## 2022-05-06 DIAGNOSIS — Z79899 Other long term (current) drug therapy: Secondary | ICD-10-CM | POA: Diagnosis not present

## 2022-05-06 DIAGNOSIS — R32 Unspecified urinary incontinence: Secondary | ICD-10-CM | POA: Diagnosis not present

## 2022-05-06 DIAGNOSIS — R159 Full incontinence of feces: Secondary | ICD-10-CM | POA: Diagnosis not present

## 2022-05-06 DIAGNOSIS — G35 Multiple sclerosis: Secondary | ICD-10-CM | POA: Diagnosis not present

## 2022-05-06 DIAGNOSIS — N390 Urinary tract infection, site not specified: Secondary | ICD-10-CM | POA: Diagnosis not present

## 2022-05-07 NOTE — Telephone Encounter (Signed)
I called patient.  She reports that she received the teriflunomide yesterday, April 3.  She has taken 1 pill.  She is tolerating it well.  Her lab appointments for the next 6 months while on teriflunomide have already been scheduled.  She will let us know of interim questions or concerns.

## 2022-05-21 DIAGNOSIS — N390 Urinary tract infection, site not specified: Secondary | ICD-10-CM | POA: Diagnosis not present

## 2022-05-21 DIAGNOSIS — G35 Multiple sclerosis: Secondary | ICD-10-CM | POA: Diagnosis not present

## 2022-05-21 DIAGNOSIS — R159 Full incontinence of feces: Secondary | ICD-10-CM | POA: Diagnosis not present

## 2022-05-21 DIAGNOSIS — R32 Unspecified urinary incontinence: Secondary | ICD-10-CM | POA: Diagnosis not present

## 2022-06-04 ENCOUNTER — Other Ambulatory Visit (INDEPENDENT_AMBULATORY_CARE_PROVIDER_SITE_OTHER): Payer: Self-pay

## 2022-06-04 DIAGNOSIS — Z79899 Other long term (current) drug therapy: Secondary | ICD-10-CM | POA: Diagnosis not present

## 2022-06-04 DIAGNOSIS — G35 Multiple sclerosis: Secondary | ICD-10-CM | POA: Diagnosis not present

## 2022-06-04 DIAGNOSIS — G35D Multiple sclerosis, unspecified: Secondary | ICD-10-CM

## 2022-06-04 DIAGNOSIS — Z0289 Encounter for other administrative examinations: Secondary | ICD-10-CM

## 2022-06-05 LAB — HEPATIC FUNCTION PANEL
ALT: 21 IU/L (ref 0–32)
AST: 21 IU/L (ref 0–40)
Albumin: 4.3 g/dL (ref 3.8–4.8)
Alkaline Phosphatase: 112 IU/L (ref 44–121)
Bilirubin Total: <0.2 mg/dL (ref 0.0–1.2)
Bilirubin, Direct: <0.1 mg/dL (ref 0.00–0.40)
Total Protein: 6.3 g/dL (ref 6.0–8.5)

## 2022-06-08 ENCOUNTER — Telehealth: Payer: Self-pay | Admitting: *Deleted

## 2022-06-08 NOTE — Telephone Encounter (Signed)
-----   Message from Asa Lente, MD sent at 06/05/2022 10:33 AM EDT ----- Please let the patient know that the lab work is fine.

## 2022-06-08 NOTE — Telephone Encounter (Signed)
LVM for pt to call about results.   Phone room: if pt calls back, please let her know lab work was okay per Dr. Epimenio Foot.

## 2022-06-10 NOTE — Telephone Encounter (Signed)
LVM for pt to call office

## 2022-06-15 ENCOUNTER — Encounter: Payer: Self-pay | Admitting: *Deleted

## 2022-06-24 DIAGNOSIS — M7071 Other bursitis of hip, right hip: Secondary | ICD-10-CM | POA: Diagnosis not present

## 2022-06-24 DIAGNOSIS — K219 Gastro-esophageal reflux disease without esophagitis: Secondary | ICD-10-CM | POA: Diagnosis not present

## 2022-06-25 DIAGNOSIS — N952 Postmenopausal atrophic vaginitis: Secondary | ICD-10-CM | POA: Diagnosis not present

## 2022-06-25 DIAGNOSIS — N390 Urinary tract infection, site not specified: Secondary | ICD-10-CM | POA: Diagnosis not present

## 2022-06-25 DIAGNOSIS — R32 Unspecified urinary incontinence: Secondary | ICD-10-CM | POA: Diagnosis not present

## 2022-06-25 DIAGNOSIS — R159 Full incontinence of feces: Secondary | ICD-10-CM | POA: Diagnosis not present

## 2022-07-06 ENCOUNTER — Other Ambulatory Visit (INDEPENDENT_AMBULATORY_CARE_PROVIDER_SITE_OTHER): Payer: Self-pay

## 2022-07-06 DIAGNOSIS — Z79899 Other long term (current) drug therapy: Secondary | ICD-10-CM | POA: Diagnosis not present

## 2022-07-06 DIAGNOSIS — G35D Multiple sclerosis, unspecified: Secondary | ICD-10-CM

## 2022-07-06 DIAGNOSIS — G35 Multiple sclerosis: Secondary | ICD-10-CM | POA: Diagnosis not present

## 2022-07-06 DIAGNOSIS — Z0289 Encounter for other administrative examinations: Secondary | ICD-10-CM

## 2022-07-07 ENCOUNTER — Telehealth: Payer: Self-pay | Admitting: *Deleted

## 2022-07-07 DIAGNOSIS — M7071 Other bursitis of hip, right hip: Secondary | ICD-10-CM | POA: Diagnosis not present

## 2022-07-07 LAB — HEPATIC FUNCTION PANEL
ALT: 14 IU/L (ref 0–32)
AST: 17 IU/L (ref 0–40)
Albumin: 4 g/dL (ref 3.8–4.8)
Alkaline Phosphatase: 103 IU/L (ref 44–121)
Bilirubin Total: <0.2 mg/dL (ref 0.0–1.2)
Bilirubin, Direct: <0.1 mg/dL (ref 0.00–0.40)
Total Protein: 5.8 g/dL — ABNORMAL LOW (ref 6.0–8.5)

## 2022-07-07 NOTE — Telephone Encounter (Signed)
Sharitta in phone informed patient labs work is fine.

## 2022-07-07 NOTE — Telephone Encounter (Signed)
Pt called back. I informed her of message Dr. Epimenio Foot sent. Pt said that's good news have a great day.

## 2022-07-07 NOTE — Telephone Encounter (Signed)
Left message for patient to call.

## 2022-07-07 NOTE — Telephone Encounter (Signed)
-----   Message from Asa Lente, MD sent at 07/07/2022 12:42 PM EDT ----- Please let the patient know that the lab work is fine.

## 2022-07-12 DIAGNOSIS — G35 Multiple sclerosis: Secondary | ICD-10-CM | POA: Diagnosis not present

## 2022-07-12 DIAGNOSIS — E860 Dehydration: Secondary | ICD-10-CM | POA: Diagnosis not present

## 2022-07-12 DIAGNOSIS — R531 Weakness: Secondary | ICD-10-CM | POA: Diagnosis not present

## 2022-07-13 ENCOUNTER — Telehealth: Payer: Self-pay | Admitting: Neurology

## 2022-07-13 NOTE — Telephone Encounter (Signed)
Pt stated she had a MS flare up. Stated she is calling to see if she needs a sooner appointment to see Dr. Epimenio Foot.

## 2022-07-13 NOTE — Telephone Encounter (Addendum)
Pt called to let you know she went to [facility] yesterday due to MS flare up. Pt said she was given Solumedrol at the hospital and feels much better. Pt said she wanted to relay this to you, she was a new patient in March 2024 and has 6 month follow up visit scheduled on 10/14/22.    This is just an FYI, pt just asked me to relay.

## 2022-07-14 DIAGNOSIS — G35 Multiple sclerosis: Secondary | ICD-10-CM | POA: Diagnosis not present

## 2022-07-14 NOTE — Telephone Encounter (Signed)
Noted  

## 2022-07-15 DIAGNOSIS — G35 Multiple sclerosis: Secondary | ICD-10-CM | POA: Diagnosis not present

## 2022-07-16 DIAGNOSIS — G35 Multiple sclerosis: Secondary | ICD-10-CM | POA: Diagnosis not present

## 2022-07-17 DIAGNOSIS — Z6834 Body mass index (BMI) 34.0-34.9, adult: Secondary | ICD-10-CM | POA: Diagnosis not present

## 2022-07-17 DIAGNOSIS — G35 Multiple sclerosis: Secondary | ICD-10-CM | POA: Diagnosis not present

## 2022-07-20 NOTE — Telephone Encounter (Signed)
Called pt. Offered sooner appt 07/27/22 at 930am w/ Dr. Epimenio Foot. Pt accepted. I scheduled pt.

## 2022-07-20 NOTE — Telephone Encounter (Signed)
Pt said since visit to hospital on 07/12/22 when swallow feels like something cutting the inside of throat. Whether drinking or eating. Does not effect breathing. Would like a call back to discuss a sooner appt. My primary care instructed to follow up with Dr. Epimenio Foot.

## 2022-07-27 ENCOUNTER — Ambulatory Visit: Payer: Medicare Other | Admitting: Neurology

## 2022-07-27 ENCOUNTER — Encounter: Payer: Self-pay | Admitting: Neurology

## 2022-07-27 VITALS — BP 128/65 | HR 78 | Ht 65.0 in | Wt 209.5 lb

## 2022-07-27 DIAGNOSIS — Z79899 Other long term (current) drug therapy: Secondary | ICD-10-CM | POA: Diagnosis not present

## 2022-07-27 DIAGNOSIS — G35 Multiple sclerosis: Secondary | ICD-10-CM | POA: Diagnosis not present

## 2022-07-27 DIAGNOSIS — R5383 Other fatigue: Secondary | ICD-10-CM | POA: Diagnosis not present

## 2022-07-27 DIAGNOSIS — E559 Vitamin D deficiency, unspecified: Secondary | ICD-10-CM

## 2022-07-27 DIAGNOSIS — N3941 Urge incontinence: Secondary | ICD-10-CM | POA: Diagnosis not present

## 2022-07-27 DIAGNOSIS — R269 Unspecified abnormalities of gait and mobility: Secondary | ICD-10-CM

## 2022-07-27 NOTE — Progress Notes (Addendum)
[facility]  PATIENT: Carol Green DOB: May 06, 1950  REFERRING DOCTOR OR PCP: Luna Kitchens, MD; Harlen Labs MD (neurology SOURCE: Patient, note from [facility] neurology, imaging and lab reports, multiple MRI images personally reviewed.  _________________________________   HISTORICAL  CHIEF COMPLAINT:  Chief Complaint  Patient presents with   Room 10    Pt is here with her best friend. Pt states that things have been fine until she had a flare up on the 9th of this month.  Pt states she is having some muscle weakness today in her left side. Pt states that her balance still hasn't gotten better since her last flare up.     HISTORY OF PRESENT ILLNESS:   Carol Green  is a 72 year old woman with multiple sclerosis.  Update 07/27/2022 She thinks she is having an MS flare-up last month.  She has noted some dysphagia.   She has a scratching sensation.   Even soft foods seem to be going down with scratching.     She is not coughing after eating/drinking.    On December she had an EGD because food was coming back up (not vomiting, more right after she swallowed a chunk would come back up)  Currently, she uses a cane for balance and left knee pain (gives out due to pain)  These combine to affect gait.   She has no falls.   She rarely uses stairs but notes difficulty going up a couple steps.  She needs to hold the bannister tightly with stairs (and has done so x many years).   She notes her left knee seems weaker than her right, even before the knee surgery.    Arm strength is fine.    She has mild numbness and tingling left > right.     Vision is fine (she sees [facility] since starting Gilenya)  She has glaucoma but does not have ME or changes c/w ON.     She has urinary urgency/frequency and has incontinence.  She is on [place].   Sometimes she has hesitancy (moreso the last few months).   She has bowel urgency and incontinence.   Incontinence has been preset x years.  She wears  Depends.  She had a UTI last October associated with severe tremors.    She notes the milder tremor is worse with intention (holding utensils or a cup but not writing).   They are not worsened by mood or nervousness.    The tremor flares up now and then.     She denies depression or anxiety.      She sleeps well with Tylenol PM.    She has nonocturia most nights.   She has had 3 x nocturia lately and some incontinence.  She reports a yeast skin infection under her breast.    MS History She was diagnosed in 1995.  At that time she had the acute onset of numbness and weakness in the left arm and leg x 3 da andys.   She was admitted to the hospital [facility]).   MRI of the brain and spine was reportedly consistent with MS.  She also had an LP.   She was told the MRI and spinal fluid were c/w MS.   She received a few days of IV Solu-Medrol.  She believes the cervical spine MRI was normal.    The oldest MRI we have is from 2009 and shows nonspecific white matter changes in the brain and pons.  Unfortunately I  do not have any of the spine MRIs that might better explain her symptoms at that time.  She was diagnosed by Dr. Tinnie Gens and placed on Betaseron in 1996.  At some point she was switched to Gilenya around 2011 to avoid needles.  In February/March 2023 she switched to Ocrevus.      Initially, she was seeing Dr. Harriette Bouillon and followed him from [facility] to the [place] area.  Upon his death she saw her Dr. Harlen Labs at [facility].   Due to risk/benefit of Ocrevus concerns at her age, I swithced her to [place] in early 2024.      Imaging: MRI of the brain 11/05/2021 showed scattered T2/FLAIR hyperintense foci in the subcortical and deep white matter with just a couple of the periventricular white matter.  Foci were also noted in the left anterior midbrain and pons.  Compared to the MRI from 12/08/2018, there is minimal increase in size of some of the subcortical/deep white matter foci but no  definite new lesion.  The MRI of the brain from 2009 also shows that focus in the midbrain, and some foci in the pons and hemispheres, mostly nonspecific though a couple or periventricular and 1 is juxtacortical  No cervical spine results were available.   Swallow study 11/24/2018  Patient found to have normal oropharyngeal swallowing function with primary esophageal dysfunction characterized by backflow of all consistencies to the upper esophagus. Patient reports having heart burn for the last month at bedtime, but has gotten worse and more frequently during the daytime now. She began taking OTC Equalactin tablets before breakfast and dinner one week ago, with no noticible improvement. During the esophageal sweep food was noted sitting throughout the esophagus. Results provided verbally to patient as well as modifications, many of which the patient reports she is already doing (Eat slowly, alternate liquids and solids, small bites and sips). Recommend patient follow up with Gastroenterology for further management of reflux. No further Speech Therapy is recommended at this time.      REVIEW OF SYSTEMS: Constitutional: No fevers, chills, sweats, or change in appetite Eyes: No visual changes, double vision, eye pain Ear, nose and throat: No hearing loss, ear pain, nasal congestion, sore throat Cardiovascular: No chest pain, palpitations Respiratory:  No shortness of breath at rest or with exertion.   No wheezes GastrointestinaI: No nausea, vomiting, diarrhea, abdominal pain, fecal incontinence Genitourinary:  No dysuria, urinary retention or frequency.  No nocturia. Musculoskeletal:  No neck pain, back pain Integumentary: No rash, pruritus, skin lesions Neurological: as above Psychiatric: No depression at this time.  No anxiety Endocrine: No palpitations, diaphoresis, change in appetite, change in weigh or increased thirst Hematologic/Lymphatic:  No anemia, purpura,  petechiae. Allergic/Immunologic: No itchy/runny eyes, nasal congestion, recent allergic reactions, rashes  ALLERGIES: Allergies  Allergen Reactions   Codeine Anaphylaxis and Other (See Comments)   Latex     Other reaction(s): Other (See Comments)    Tape Rash    Pt can use paper tape    HOME MEDICATIONS:  Current Outpatient Medications:    acetaminophen (TYLENOL) 500 MG tablet, Take 2 tablets (1,000 mg total) by mouth every 8 (eight) hours., Disp: 30 tablet, Rfl: 0   atorvastatin (LIPITOR) 40 MG tablet, Take 40 mg by mouth at bedtime., Disp: , Rfl:    baclofen (LIORESAL) 10 MG tablet, Take 10 mg by mouth 3 (three) times daily as needed for muscle spasms., Disp: , Rfl:    Cholecalciferol (VITAMIN D3) 50  MCG (2000 UT) TABS, Take by mouth., Disp: , Rfl:    CRANBERRY PO, Take 2 tablets by mouth daily at 12 noon., Disp: , Rfl:    gabapentin (NEURONTIN) 600 MG tablet, Take 600 mg by mouth in the morning, at noon, in the evening, and at bedtime., Disp: , Rfl:    GEMTESA 75 MG TABS, Take 1 tablet by mouth at bedtime., Disp: , Rfl:    lisinopril-hydrochlorothiazide (ZESTORETIC) 20-12.5 MG tablet, Take 1 tablet by mouth daily., Disp: , Rfl:    meloxicam (MOBIC) 15 MG tablet, Take 15 mg by mouth daily as needed for pain., Disp: , Rfl:    mirabegron ER (MYRBETRIQ) 50 MG TB24 tablet, Take 50 mg by mouth daily., Disp: , Rfl:    Multiple Vitamin (MULTIVITAMIN WITH MINERALS) TABS tablet, Take 1 tablet by mouth daily at 12 noon., Disp: , Rfl:    nitrofurantoin (MACRODANTIN) 100 MG capsule, Take 100 mg by mouth 4 (four) times daily., Disp: , Rfl:    pantoprazole (PROTONIX) 40 MG tablet, Take 40 mg by mouth 2 (two) times daily. Before breakfast & before supper, Disp: , Rfl:    Teriflunomide 14 MG TABS, Take 1 tablet (14 mg total) by mouth daily., Disp: 90 tablet, Rfl: 3   ALPRAZolam (XANAX) 0.5 MG tablet, Take 0.5 mg by mouth 2 (two) times daily as needed for anxiety. (Patient not taking: Reported on  04/13/2022), Disp: , Rfl:    aspirin EC 81 MG tablet, Take 162 mg by mouth daily. Swallow whole. (Patient not taking: Reported on 04/13/2022), Disp: , Rfl:    DULoxetine (CYMBALTA) 20 MG capsule, Take 1 capsule (20 mg total) by mouth daily. (Patient not taking: Reported on 04/13/2022), Disp: 30 capsule, Rfl: 2   Multiple Vitamins-Minerals (HAIR/SKIN/NAILS/BIOTIN PO), Take 1 tablet by mouth daily with supper., Disp: , Rfl:    Propylene Glycol (SYSTANE BALANCE) 0.6 % SOLN, Place 1 drop into both eyes daily as needed (dry eyes). (Patient not taking: Reported on 04/13/2022), Disp: , Rfl:    valACYclovir (VALTREX) 1000 MG tablet, Take 1,000 mg by mouth daily. (Patient not taking: Reported on 04/13/2022), Disp: , Rfl:   PAST MEDICAL HISTORY: Past Medical History:  Diagnosis Date   Acid reflux    Alopecia    Arthritis    Chronic cystitis    Diverticulosis    Hypertension    Multiple sclerosis (HCC)    Varicose veins     PAST SURGICAL HISTORY: Past Surgical History:  Procedure Laterality Date   ABDOMINAL HYSTERECTOMY  1994   CHOLECYSTECTOMY  1987   FRACTURE SURGERY Left 2002   OTHER SURGICAL HISTORY Left 2004   removal fatty tumor left arm   SHOULDER SURGERY Bilateral 2003 - right; 2008 - left   TOTAL KNEE ARTHROPLASTY Left 08/01/2019   Procedure: TOTAL KNEE ARTHROPLASTY;  Surgeon: Durene Romans, MD;  Location: [facility];  Service: Orthopedics;  Laterality: Left;  70 mins   TUBAL LIGATION  1977    FAMILY HISTORY: Family History  Problem Relation Age of Onset   Cancer Sister        breast   Deep vein thrombosis Brother    Other Brother        varicose veins   Diabetes Sister    Deep vein thrombosis Sister    Hypertension Sister    Hyperlipidemia Sister    Other Sister        varicose veins   Breast cancer Sister    Cancer Mother  ovarian   Other Father        varicose veins   Deep vein thrombosis Father     SOCIAL HISTORY: Social History   Socioeconomic History    Marital status: Married    Spouse name: Not on file   Number of children: Not on file   Years of education: Not on file   Highest education level: Not on file  Occupational History   Not on file  Tobacco Use   Smoking status: Never   Smokeless tobacco: Never  Vaping Use   Vaping Use: Never used  Substance and Sexual Activity   Alcohol use: No   Drug use: No   Sexual activity: Not on file  Other Topics Concern   Not on file  Social History Narrative   Right handed    Wear reader glasses only    Does not drink caffeine    Social Determinants of Health   Financial Resource Strain: Not on file  Food Insecurity: Not on file  Transportation Needs: Not on file  Physical Activity: Not on file  Stress: Not on file  Social Connections: Not on file  Intimate Partner Violence: Not on file       PHYSICAL EXAM  Vitals:   07/27/22 0955  BP: 128/65  Pulse: 78  Weight: 209 lb 8 oz (95 kg)  Height: 5\' 5"  (1.651 m)     Body mass index is 34.86 kg/m.   General: The patient is well-developed and well-nourished and in no acute distress  HEENT:  Head is [place]/AT.  Sclera are anicteric.    Skin: Extremities are without rash.   No edema.  Neurologic Exam  Mental status: The patient is alert and oriented x 3 at the time of the examination. The patient has apparent normal recent and remote memory, with an apparently normal attention span and concentration ability.   Speech is normal.  Cranial nerves: Extraocular movements are full.  Facial strengh and sensation are fine.Trapezius and sternocleidomastoid strength is normal. No dysarthria is noted.  The tongue is midline, and the patient has symmetric elevation of the soft palate. No obvious hearing deficits are noted.  Motor:  Muscle bulk is normal.   Tone is normal. Strength is  5 / 5 in all 4 extremities.   Sensory: She reports slgihtly less sensation to vibration and touch on the left.    Coordination: Cerebellar testing  reveals good finger-nose-finger and heel-to-shin bilaterally.  Gait and station: Station is normal.   Gait is arthritic and mildly wide.   Tandem is poor/wide..  Her turn is wide.  Romberg is negative.   Reflexes: Deep tendon reflexes are symmetric and normal bilaterally.      DIAGNOSTIC DATA (LABS, IMAGING, TESTING) - I reviewed patient records, labs, notes, testing and imaging myself where available.  Lab Results  Component Value Date   WBC 5.9 04/13/2022   HGB 12.7 04/13/2022   HCT 38.3 04/13/2022   MCV 90 04/13/2022   PLT 178 04/13/2022      Component Value Date/Time   NA 145 (H) 04/13/2022 1004   K 4.1 04/13/2022 1004   CL 106 04/13/2022 1004   CO2 25 04/13/2022 1004   GLUCOSE 101 (H) 04/13/2022 1004   GLUCOSE 130 (H) 11/05/2021 2212   BUN 16 04/13/2022 1004   CREATININE 1.09 (H) 04/13/2022 1004   CALCIUM 9.8 04/13/2022 1004   PROT 5.8 (L) 07/06/2022 1038   ALBUMIN 4.0 07/06/2022 1038   AST 17 07/06/2022  1038   ALT 14 07/06/2022 1038   ALKPHOS 103 07/06/2022 1038   BILITOT <0.2 07/06/2022 1038   GFRNONAA >60 11/05/2021 2212   GFRAA >60 08/02/2019 0247    Lab Results  Component Value Date   HGBA1C 6.4 (H) 11/05/2021        ASSESSMENT AND PLAN  Multiple sclerosis (HCC)  High risk medication use  Other fatigue  Gait disturbance  Urge incontinence of urine  Vitamin D deficiency   At the last visit, she switched from Ocrevus to teriflunomide.  LFTs have been fine so far.   I do not think new symptoms represent an exacerbation  Stay active and exercise as tolerated Check UA, Cx and labs due to increased freq/urg.  She has no Abx allergy She will be seeing Orthopedics (Dr. Jonell Cluck) tomorrow for her left knee Rtc 6 months  This visit is part of a comprehensive longitudinal care medical relationship regarding the patients primary diagnosis of MS and related concerns.     Dariann Huckaba A. Epimenio Foot, MD, [facility] 07/27/2022, 10:07 AM Certified in Neurology,  Clinical Neurophysiology, Sleep Medicine and Neuroimaging  [facility] [address] [place], [place] [address] [phone]

## 2022-07-28 DIAGNOSIS — Z96652 Presence of left artificial knee joint: Secondary | ICD-10-CM | POA: Diagnosis not present

## 2022-07-28 DIAGNOSIS — T8484XA Pain due to internal orthopedic prosthetic devices, implants and grafts, initial encounter: Secondary | ICD-10-CM | POA: Diagnosis not present

## 2022-07-28 DIAGNOSIS — M25462 Effusion, left knee: Secondary | ICD-10-CM | POA: Diagnosis not present

## 2022-07-28 DIAGNOSIS — M25562 Pain in left knee: Secondary | ICD-10-CM | POA: Diagnosis not present

## 2022-07-28 LAB — CBC WITH DIFFERENTIAL/PLATELET
Basophils Absolute: 0 10*3/uL (ref 0.0–0.2)
Basos: 0 %
EOS (ABSOLUTE): 0.1 10*3/uL (ref 0.0–0.4)
Eos: 1 %
Hematocrit: 35.1 % (ref 34.0–46.6)
Hemoglobin: 11.6 g/dL (ref 11.1–15.9)
Immature Grans (Abs): 0 10*3/uL (ref 0.0–0.1)
Immature Granulocytes: 0 %
Lymphocytes Absolute: 0.5 10*3/uL — ABNORMAL LOW (ref 0.7–3.1)
Lymphs: 7 %
MCH: 30 pg (ref 26.6–33.0)
MCHC: 33 g/dL (ref 31.5–35.7)
MCV: 91 fL (ref 79–97)
Monocytes Absolute: 0.6 10*3/uL (ref 0.1–0.9)
Monocytes: 8 %
Neutrophils Absolute: 6.2 10*3/uL (ref 1.4–7.0)
Neutrophils: 84 %
Platelets: 128 10*3/uL — ABNORMAL LOW (ref 150–450)
RBC: 3.87 x10E6/uL (ref 3.77–5.28)
RDW: 14.1 % (ref 11.7–15.4)
WBC: 7.4 10*3/uL (ref 3.4–10.8)

## 2022-07-28 LAB — COMPREHENSIVE METABOLIC PANEL
ALT: 25 IU/L (ref 0–32)
AST: 26 IU/L (ref 0–40)
Albumin: 3.9 g/dL (ref 3.8–4.8)
Alkaline Phosphatase: 81 IU/L (ref 44–121)
BUN/Creatinine Ratio: 20 (ref 12–28)
BUN: 18 mg/dL (ref 8–27)
Bilirubin Total: 0.2 mg/dL (ref 0.0–1.2)
CO2: 24 mmol/L (ref 20–29)
Calcium: 9.2 mg/dL (ref 8.7–10.3)
Chloride: 111 mmol/L — ABNORMAL HIGH (ref 96–106)
Creatinine, Ser: 0.92 mg/dL (ref 0.57–1.00)
Globulin, Total: 1.9 g/dL (ref 1.5–4.5)
Glucose: 76 mg/dL (ref 70–99)
Potassium: 4.2 mmol/L (ref 3.5–5.2)
Sodium: 148 mmol/L — ABNORMAL HIGH (ref 134–144)
Total Protein: 5.8 g/dL — ABNORMAL LOW (ref 6.0–8.5)
eGFR: 66 mL/min/{1.73_m2} (ref >59)

## 2022-07-28 LAB — URINALYSIS
Bilirubin, UA: NEGATIVE
Glucose, UA: NEGATIVE
Ketones, UA: NEGATIVE
Leukocytes,UA: NEGATIVE
Nitrite, UA: NEGATIVE
Protein,UA: NEGATIVE
RBC, UA: NEGATIVE
Specific Gravity, UA: 1.02 (ref 1.005–1.030)
Urobilinogen, Ur: 0.2 mg/dL (ref 0.2–1.0)
pH, UA: 7 (ref 5.0–7.5)

## 2022-07-29 LAB — URINE CULTURE

## 2022-08-04 ENCOUNTER — Other Ambulatory Visit: Payer: Medicare Other

## 2022-08-11 DIAGNOSIS — Z0000 Encounter for general adult medical examination without abnormal findings: Secondary | ICD-10-CM | POA: Diagnosis not present

## 2022-08-11 DIAGNOSIS — R42 Dizziness and giddiness: Secondary | ICD-10-CM | POA: Diagnosis not present

## 2022-08-11 DIAGNOSIS — Z1331 Encounter for screening for depression: Secondary | ICD-10-CM | POA: Diagnosis not present

## 2022-08-11 DIAGNOSIS — Z9181 History of falling: Secondary | ICD-10-CM | POA: Diagnosis not present

## 2022-08-24 DIAGNOSIS — G919 Hydrocephalus, unspecified: Secondary | ICD-10-CM | POA: Diagnosis not present

## 2022-08-24 DIAGNOSIS — Z9849 Cataract extraction status, unspecified eye: Secondary | ICD-10-CM | POA: Diagnosis not present

## 2022-08-25 DIAGNOSIS — Z96659 Presence of unspecified artificial knee joint: Secondary | ICD-10-CM | POA: Diagnosis not present

## 2022-08-25 DIAGNOSIS — T8484XA Pain due to internal orthopedic prosthetic devices, implants and grafts, initial encounter: Secondary | ICD-10-CM | POA: Diagnosis not present

## 2022-08-26 DIAGNOSIS — R159 Full incontinence of feces: Secondary | ICD-10-CM | POA: Diagnosis not present

## 2022-08-26 DIAGNOSIS — R32 Unspecified urinary incontinence: Secondary | ICD-10-CM | POA: Diagnosis not present

## 2022-08-26 DIAGNOSIS — N319 Neuromuscular dysfunction of bladder, unspecified: Secondary | ICD-10-CM | POA: Diagnosis not present

## 2022-08-26 DIAGNOSIS — G35 Multiple sclerosis: Secondary | ICD-10-CM | POA: Diagnosis not present

## 2022-08-26 DIAGNOSIS — N390 Urinary tract infection, site not specified: Secondary | ICD-10-CM | POA: Diagnosis not present

## 2022-08-26 DIAGNOSIS — R1032 Left lower quadrant pain: Secondary | ICD-10-CM | POA: Diagnosis not present

## 2022-08-26 DIAGNOSIS — R350 Frequency of micturition: Secondary | ICD-10-CM | POA: Diagnosis not present

## 2022-09-02 DIAGNOSIS — R42 Dizziness and giddiness: Secondary | ICD-10-CM | POA: Diagnosis not present

## 2022-09-02 DIAGNOSIS — G35 Multiple sclerosis: Secondary | ICD-10-CM | POA: Diagnosis not present

## 2022-09-02 DIAGNOSIS — Z6832 Body mass index (BMI) 32.0-32.9, adult: Secondary | ICD-10-CM | POA: Diagnosis not present

## 2022-09-07 ENCOUNTER — Other Ambulatory Visit: Payer: Medicare Other

## 2022-09-08 ENCOUNTER — Other Ambulatory Visit (INDEPENDENT_AMBULATORY_CARE_PROVIDER_SITE_OTHER): Payer: Self-pay

## 2022-09-08 DIAGNOSIS — G35 Multiple sclerosis: Secondary | ICD-10-CM

## 2022-09-08 DIAGNOSIS — G35D Multiple sclerosis, unspecified: Secondary | ICD-10-CM

## 2022-09-08 DIAGNOSIS — Z79899 Other long term (current) drug therapy: Secondary | ICD-10-CM

## 2022-09-08 DIAGNOSIS — Z0289 Encounter for other administrative examinations: Secondary | ICD-10-CM

## 2022-09-09 DIAGNOSIS — Z79899 Other long term (current) drug therapy: Secondary | ICD-10-CM | POA: Diagnosis not present

## 2022-09-09 DIAGNOSIS — G35 Multiple sclerosis: Secondary | ICD-10-CM | POA: Diagnosis not present

## 2022-09-14 DIAGNOSIS — Z6832 Body mass index (BMI) 32.0-32.9, adult: Secondary | ICD-10-CM | POA: Diagnosis not present

## 2022-09-14 DIAGNOSIS — M545 Low back pain, unspecified: Secondary | ICD-10-CM | POA: Diagnosis not present

## 2022-09-16 DIAGNOSIS — T8484XA Pain due to internal orthopedic prosthetic devices, implants and grafts, initial encounter: Secondary | ICD-10-CM | POA: Diagnosis not present

## 2022-09-16 DIAGNOSIS — M25562 Pain in left knee: Secondary | ICD-10-CM | POA: Diagnosis not present

## 2022-09-16 DIAGNOSIS — Z96652 Presence of left artificial knee joint: Secondary | ICD-10-CM | POA: Diagnosis not present

## 2022-09-16 DIAGNOSIS — Y838 Other surgical procedures as the cause of abnormal reaction of the patient, or of later complication, without mention of misadventure at the time of the procedure: Secondary | ICD-10-CM | POA: Diagnosis not present

## 2022-09-18 DIAGNOSIS — M545 Low back pain, unspecified: Secondary | ICD-10-CM | POA: Diagnosis not present

## 2022-09-22 DIAGNOSIS — M545 Low back pain, unspecified: Secondary | ICD-10-CM | POA: Diagnosis not present

## 2022-09-25 DIAGNOSIS — M545 Low back pain, unspecified: Secondary | ICD-10-CM | POA: Diagnosis not present

## 2022-09-28 ENCOUNTER — Encounter: Payer: Self-pay | Admitting: Neurology

## 2022-09-28 ENCOUNTER — Institutional Professional Consult (permissible substitution): Payer: Medicare Other | Admitting: Neurology

## 2022-09-29 DIAGNOSIS — T8484XA Pain due to internal orthopedic prosthetic devices, implants and grafts, initial encounter: Secondary | ICD-10-CM | POA: Diagnosis not present

## 2022-09-29 DIAGNOSIS — Z96659 Presence of unspecified artificial knee joint: Secondary | ICD-10-CM | POA: Diagnosis not present

## 2022-09-30 DIAGNOSIS — M545 Low back pain, unspecified: Secondary | ICD-10-CM | POA: Diagnosis not present

## 2022-10-02 DIAGNOSIS — M545 Low back pain, unspecified: Secondary | ICD-10-CM | POA: Diagnosis not present

## 2022-10-08 ENCOUNTER — Other Ambulatory Visit: Payer: Medicare Other

## 2022-10-09 DIAGNOSIS — M545 Low back pain, unspecified: Secondary | ICD-10-CM | POA: Diagnosis not present

## 2022-10-14 ENCOUNTER — Ambulatory Visit: Payer: Medicare Other | Admitting: Neurology

## 2022-10-16 DIAGNOSIS — M545 Low back pain, unspecified: Secondary | ICD-10-CM | POA: Diagnosis not present

## 2022-10-23 DIAGNOSIS — R8271 Bacteriuria: Secondary | ICD-10-CM | POA: Diagnosis not present

## 2022-10-27 DIAGNOSIS — M545 Low back pain, unspecified: Secondary | ICD-10-CM | POA: Diagnosis not present

## 2022-10-29 DIAGNOSIS — N3946 Mixed incontinence: Secondary | ICD-10-CM | POA: Diagnosis not present

## 2022-11-03 DIAGNOSIS — R32 Unspecified urinary incontinence: Secondary | ICD-10-CM | POA: Diagnosis not present

## 2022-11-03 DIAGNOSIS — N952 Postmenopausal atrophic vaginitis: Secondary | ICD-10-CM | POA: Diagnosis not present

## 2022-11-03 DIAGNOSIS — R1032 Left lower quadrant pain: Secondary | ICD-10-CM | POA: Diagnosis not present

## 2022-11-03 DIAGNOSIS — N390 Urinary tract infection, site not specified: Secondary | ICD-10-CM | POA: Diagnosis not present

## 2022-11-04 ENCOUNTER — Other Ambulatory Visit: Payer: Medicare Other

## 2022-11-10 DIAGNOSIS — M545 Low back pain, unspecified: Secondary | ICD-10-CM | POA: Diagnosis not present

## 2022-11-10 DIAGNOSIS — Z6831 Body mass index (BMI) 31.0-31.9, adult: Secondary | ICD-10-CM | POA: Diagnosis not present

## 2022-11-10 DIAGNOSIS — G35 Multiple sclerosis: Secondary | ICD-10-CM | POA: Diagnosis not present

## 2022-12-22 DIAGNOSIS — Z23 Encounter for immunization: Secondary | ICD-10-CM | POA: Diagnosis not present

## 2022-12-22 DIAGNOSIS — M25561 Pain in right knee: Secondary | ICD-10-CM | POA: Diagnosis not present

## 2023-01-04 DIAGNOSIS — G35 Multiple sclerosis: Secondary | ICD-10-CM | POA: Diagnosis not present

## 2023-01-04 DIAGNOSIS — N3281 Overactive bladder: Secondary | ICD-10-CM | POA: Diagnosis not present

## 2023-01-04 DIAGNOSIS — N3946 Mixed incontinence: Secondary | ICD-10-CM | POA: Diagnosis not present

## 2023-01-13 DIAGNOSIS — Z01419 Encounter for gynecological examination (general) (routine) without abnormal findings: Secondary | ICD-10-CM | POA: Diagnosis not present

## 2023-01-13 DIAGNOSIS — Z6833 Body mass index (BMI) 33.0-33.9, adult: Secondary | ICD-10-CM | POA: Diagnosis not present

## 2023-01-13 DIAGNOSIS — Z1231 Encounter for screening mammogram for malignant neoplasm of breast: Secondary | ICD-10-CM | POA: Diagnosis not present

## 2023-01-13 DIAGNOSIS — M8588 Other specified disorders of bone density and structure, other site: Secondary | ICD-10-CM | POA: Diagnosis not present

## 2023-01-21 DIAGNOSIS — M1711 Unilateral primary osteoarthritis, right knee: Secondary | ICD-10-CM | POA: Diagnosis not present

## 2023-02-02 DIAGNOSIS — M1711 Unilateral primary osteoarthritis, right knee: Secondary | ICD-10-CM | POA: Diagnosis not present

## 2023-02-04 ENCOUNTER — Ambulatory Visit: Payer: Medicare Other | Admitting: Neurology

## 2023-02-18 DIAGNOSIS — M8580 Other specified disorders of bone density and structure, unspecified site: Secondary | ICD-10-CM | POA: Diagnosis not present

## 2023-02-18 DIAGNOSIS — G35 Multiple sclerosis: Secondary | ICD-10-CM | POA: Diagnosis not present

## 2023-03-15 DIAGNOSIS — G35 Multiple sclerosis: Secondary | ICD-10-CM | POA: Diagnosis not present

## 2023-03-15 DIAGNOSIS — N3281 Overactive bladder: Secondary | ICD-10-CM | POA: Diagnosis not present

## 2023-03-15 DIAGNOSIS — N3941 Urge incontinence: Secondary | ICD-10-CM | POA: Diagnosis not present

## 2023-03-15 DIAGNOSIS — R152 Fecal urgency: Secondary | ICD-10-CM | POA: Diagnosis not present

## 2023-03-15 DIAGNOSIS — R159 Full incontinence of feces: Secondary | ICD-10-CM | POA: Diagnosis not present

## 2023-03-18 DIAGNOSIS — N3281 Overactive bladder: Secondary | ICD-10-CM | POA: Diagnosis not present

## 2023-03-23 DIAGNOSIS — G35 Multiple sclerosis: Secondary | ICD-10-CM | POA: Diagnosis not present

## 2023-03-23 DIAGNOSIS — L304 Erythema intertrigo: Secondary | ICD-10-CM | POA: Diagnosis not present

## 2023-03-23 DIAGNOSIS — E782 Mixed hyperlipidemia: Secondary | ICD-10-CM | POA: Diagnosis not present

## 2023-03-23 DIAGNOSIS — I10 Essential (primary) hypertension: Secondary | ICD-10-CM | POA: Diagnosis not present

## 2023-03-23 DIAGNOSIS — N3020 Other chronic cystitis without hematuria: Secondary | ICD-10-CM | POA: Diagnosis not present

## 2023-03-23 DIAGNOSIS — Z6831 Body mass index (BMI) 31.0-31.9, adult: Secondary | ICD-10-CM | POA: Diagnosis not present

## 2023-03-29 DIAGNOSIS — M1711 Unilateral primary osteoarthritis, right knee: Secondary | ICD-10-CM | POA: Diagnosis not present

## 2023-03-29 DIAGNOSIS — W19XXXA Unspecified fall, initial encounter: Secondary | ICD-10-CM | POA: Diagnosis not present

## 2023-03-29 DIAGNOSIS — M25562 Pain in left knee: Secondary | ICD-10-CM | POA: Diagnosis not present

## 2023-03-29 DIAGNOSIS — Z96652 Presence of left artificial knee joint: Secondary | ICD-10-CM | POA: Diagnosis not present

## 2023-03-29 DIAGNOSIS — M25561 Pain in right knee: Secondary | ICD-10-CM | POA: Diagnosis not present

## 2023-03-31 DIAGNOSIS — G8929 Other chronic pain: Secondary | ICD-10-CM | POA: Diagnosis not present

## 2023-03-31 DIAGNOSIS — K219 Gastro-esophageal reflux disease without esophagitis: Secondary | ICD-10-CM | POA: Diagnosis not present

## 2023-03-31 DIAGNOSIS — E785 Hyperlipidemia, unspecified: Secondary | ICD-10-CM | POA: Diagnosis not present

## 2023-03-31 DIAGNOSIS — Z792 Long term (current) use of antibiotics: Secondary | ICD-10-CM | POA: Diagnosis not present

## 2023-03-31 DIAGNOSIS — G35 Multiple sclerosis: Secondary | ICD-10-CM | POA: Diagnosis not present

## 2023-03-31 DIAGNOSIS — I10 Essential (primary) hypertension: Secondary | ICD-10-CM | POA: Diagnosis not present

## 2023-03-31 DIAGNOSIS — R32 Unspecified urinary incontinence: Secondary | ICD-10-CM | POA: Diagnosis not present

## 2023-03-31 DIAGNOSIS — E669 Obesity, unspecified: Secondary | ICD-10-CM | POA: Diagnosis not present

## 2023-03-31 DIAGNOSIS — M1990 Unspecified osteoarthritis, unspecified site: Secondary | ICD-10-CM | POA: Diagnosis not present

## 2023-04-01 DIAGNOSIS — M1711 Unilateral primary osteoarthritis, right knee: Secondary | ICD-10-CM | POA: Diagnosis not present

## 2023-04-06 DIAGNOSIS — I6789 Other cerebrovascular disease: Secondary | ICD-10-CM | POA: Diagnosis not present

## 2023-04-06 DIAGNOSIS — M47814 Spondylosis without myelopathy or radiculopathy, thoracic region: Secondary | ICD-10-CM | POA: Diagnosis not present

## 2023-04-06 DIAGNOSIS — M47812 Spondylosis without myelopathy or radiculopathy, cervical region: Secondary | ICD-10-CM | POA: Diagnosis not present

## 2023-04-06 DIAGNOSIS — M4804 Spinal stenosis, thoracic region: Secondary | ICD-10-CM | POA: Diagnosis not present

## 2023-04-06 DIAGNOSIS — M4712 Other spondylosis with myelopathy, cervical region: Secondary | ICD-10-CM | POA: Diagnosis not present

## 2023-04-06 DIAGNOSIS — M4802 Spinal stenosis, cervical region: Secondary | ICD-10-CM | POA: Diagnosis not present

## 2023-04-06 DIAGNOSIS — G35 Multiple sclerosis: Secondary | ICD-10-CM | POA: Diagnosis not present

## 2023-04-06 DIAGNOSIS — M47894 Other spondylosis, thoracic region: Secondary | ICD-10-CM | POA: Diagnosis not present

## 2023-04-06 DIAGNOSIS — R9082 White matter disease, unspecified: Secondary | ICD-10-CM | POA: Diagnosis not present

## 2023-04-08 DIAGNOSIS — M1990 Unspecified osteoarthritis, unspecified site: Secondary | ICD-10-CM | POA: Diagnosis not present

## 2023-04-08 DIAGNOSIS — M810 Age-related osteoporosis without current pathological fracture: Secondary | ICD-10-CM | POA: Diagnosis not present

## 2023-04-08 DIAGNOSIS — R1312 Dysphagia, oropharyngeal phase: Secondary | ICD-10-CM | POA: Diagnosis not present

## 2023-04-08 DIAGNOSIS — Z6830 Body mass index (BMI) 30.0-30.9, adult: Secondary | ICD-10-CM | POA: Diagnosis not present

## 2023-04-21 DIAGNOSIS — R1312 Dysphagia, oropharyngeal phase: Secondary | ICD-10-CM | POA: Diagnosis not present

## 2023-04-27 DIAGNOSIS — R2689 Other abnormalities of gait and mobility: Secondary | ICD-10-CM | POA: Diagnosis not present

## 2023-04-27 DIAGNOSIS — M25661 Stiffness of right knee, not elsewhere classified: Secondary | ICD-10-CM | POA: Diagnosis not present

## 2023-04-27 DIAGNOSIS — R1312 Dysphagia, oropharyngeal phase: Secondary | ICD-10-CM | POA: Diagnosis not present

## 2023-04-27 DIAGNOSIS — M179 Osteoarthritis of knee, unspecified: Secondary | ICD-10-CM | POA: Diagnosis not present

## 2023-04-27 DIAGNOSIS — M25561 Pain in right knee: Secondary | ICD-10-CM | POA: Diagnosis not present

## 2023-04-27 DIAGNOSIS — Z6830 Body mass index (BMI) 30.0-30.9, adult: Secondary | ICD-10-CM | POA: Diagnosis not present

## 2023-04-28 DIAGNOSIS — M25561 Pain in right knee: Secondary | ICD-10-CM | POA: Diagnosis not present

## 2023-04-28 DIAGNOSIS — G8929 Other chronic pain: Secondary | ICD-10-CM | POA: Diagnosis not present

## 2023-05-03 DIAGNOSIS — G35 Multiple sclerosis: Secondary | ICD-10-CM | POA: Diagnosis not present

## 2023-05-03 DIAGNOSIS — M25561 Pain in right knee: Secondary | ICD-10-CM | POA: Diagnosis not present

## 2023-05-03 DIAGNOSIS — Z01818 Encounter for other preprocedural examination: Secondary | ICD-10-CM | POA: Diagnosis not present

## 2023-05-03 DIAGNOSIS — Z6830 Body mass index (BMI) 30.0-30.9, adult: Secondary | ICD-10-CM | POA: Diagnosis not present

## 2023-05-04 DIAGNOSIS — R2689 Other abnormalities of gait and mobility: Secondary | ICD-10-CM | POA: Diagnosis not present

## 2023-05-04 DIAGNOSIS — M25661 Stiffness of right knee, not elsewhere classified: Secondary | ICD-10-CM | POA: Diagnosis not present

## 2023-05-04 DIAGNOSIS — M25561 Pain in right knee: Secondary | ICD-10-CM | POA: Diagnosis not present

## 2023-05-20 DIAGNOSIS — M179 Osteoarthritis of knee, unspecified: Secondary | ICD-10-CM | POA: Diagnosis not present

## 2023-05-20 DIAGNOSIS — R2689 Other abnormalities of gait and mobility: Secondary | ICD-10-CM | POA: Diagnosis not present

## 2023-05-20 DIAGNOSIS — M25561 Pain in right knee: Secondary | ICD-10-CM | POA: Diagnosis not present

## 2023-05-20 DIAGNOSIS — M25661 Stiffness of right knee, not elsewhere classified: Secondary | ICD-10-CM | POA: Diagnosis not present

## 2023-05-25 DIAGNOSIS — M1711 Unilateral primary osteoarthritis, right knee: Secondary | ICD-10-CM | POA: Diagnosis not present

## 2023-05-25 DIAGNOSIS — R2689 Other abnormalities of gait and mobility: Secondary | ICD-10-CM | POA: Diagnosis not present

## 2023-05-25 DIAGNOSIS — M25561 Pain in right knee: Secondary | ICD-10-CM | POA: Diagnosis not present

## 2023-05-25 DIAGNOSIS — M25661 Stiffness of right knee, not elsewhere classified: Secondary | ICD-10-CM | POA: Diagnosis not present

## 2023-05-26 DIAGNOSIS — R1314 Dysphagia, pharyngoesophageal phase: Secondary | ICD-10-CM | POA: Diagnosis not present

## 2023-05-26 DIAGNOSIS — K222 Esophageal obstruction: Secondary | ICD-10-CM | POA: Diagnosis not present

## 2023-05-26 DIAGNOSIS — G35 Multiple sclerosis: Secondary | ICD-10-CM | POA: Diagnosis not present

## 2023-05-27 DIAGNOSIS — M25661 Stiffness of right knee, not elsewhere classified: Secondary | ICD-10-CM | POA: Diagnosis not present

## 2023-05-27 DIAGNOSIS — M179 Osteoarthritis of knee, unspecified: Secondary | ICD-10-CM | POA: Diagnosis not present

## 2023-05-27 DIAGNOSIS — M25561 Pain in right knee: Secondary | ICD-10-CM | POA: Diagnosis not present

## 2023-05-27 DIAGNOSIS — R2689 Other abnormalities of gait and mobility: Secondary | ICD-10-CM | POA: Diagnosis not present

## 2023-06-01 DIAGNOSIS — M25661 Stiffness of right knee, not elsewhere classified: Secondary | ICD-10-CM | POA: Diagnosis not present

## 2023-06-01 DIAGNOSIS — M25561 Pain in right knee: Secondary | ICD-10-CM | POA: Diagnosis not present

## 2023-06-01 DIAGNOSIS — M179 Osteoarthritis of knee, unspecified: Secondary | ICD-10-CM | POA: Diagnosis not present

## 2023-06-01 DIAGNOSIS — R2689 Other abnormalities of gait and mobility: Secondary | ICD-10-CM | POA: Diagnosis not present

## 2023-06-03 DIAGNOSIS — R531 Weakness: Secondary | ICD-10-CM | POA: Diagnosis not present

## 2023-06-03 DIAGNOSIS — G35 Multiple sclerosis: Secondary | ICD-10-CM | POA: Diagnosis not present

## 2023-06-07 DIAGNOSIS — R531 Weakness: Secondary | ICD-10-CM | POA: Diagnosis not present

## 2023-06-07 DIAGNOSIS — R2689 Other abnormalities of gait and mobility: Secondary | ICD-10-CM | POA: Diagnosis not present

## 2023-06-07 DIAGNOSIS — G35 Multiple sclerosis: Secondary | ICD-10-CM | POA: Diagnosis not present

## 2023-06-07 DIAGNOSIS — R479 Unspecified speech disturbances: Secondary | ICD-10-CM | POA: Diagnosis not present

## 2023-06-08 DIAGNOSIS — M25561 Pain in right knee: Secondary | ICD-10-CM | POA: Diagnosis not present

## 2023-06-08 DIAGNOSIS — M25661 Stiffness of right knee, not elsewhere classified: Secondary | ICD-10-CM | POA: Diagnosis not present

## 2023-06-08 DIAGNOSIS — R2689 Other abnormalities of gait and mobility: Secondary | ICD-10-CM | POA: Diagnosis not present

## 2023-06-08 DIAGNOSIS — M179 Osteoarthritis of knee, unspecified: Secondary | ICD-10-CM | POA: Diagnosis not present

## 2023-06-09 DIAGNOSIS — Z6829 Body mass index (BMI) 29.0-29.9, adult: Secondary | ICD-10-CM | POA: Diagnosis not present

## 2023-06-09 DIAGNOSIS — G35 Multiple sclerosis: Secondary | ICD-10-CM | POA: Diagnosis not present

## 2023-06-09 DIAGNOSIS — M25561 Pain in right knee: Secondary | ICD-10-CM | POA: Diagnosis not present

## 2023-06-10 DIAGNOSIS — M1711 Unilateral primary osteoarthritis, right knee: Secondary | ICD-10-CM | POA: Diagnosis not present

## 2023-06-16 DIAGNOSIS — N289 Disorder of kidney and ureter, unspecified: Secondary | ICD-10-CM | POA: Diagnosis not present

## 2023-06-16 DIAGNOSIS — G35 Multiple sclerosis: Secondary | ICD-10-CM | POA: Diagnosis not present

## 2023-07-05 DIAGNOSIS — D649 Anemia, unspecified: Secondary | ICD-10-CM | POA: Diagnosis not present

## 2023-07-05 DIAGNOSIS — N179 Acute kidney failure, unspecified: Secondary | ICD-10-CM | POA: Diagnosis not present

## 2023-07-05 DIAGNOSIS — Z6829 Body mass index (BMI) 29.0-29.9, adult: Secondary | ICD-10-CM | POA: Diagnosis not present

## 2023-07-13 DIAGNOSIS — G35 Multiple sclerosis: Secondary | ICD-10-CM | POA: Diagnosis not present

## 2023-07-20 DIAGNOSIS — N179 Acute kidney failure, unspecified: Secondary | ICD-10-CM | POA: Diagnosis not present

## 2023-07-20 DIAGNOSIS — D649 Anemia, unspecified: Secondary | ICD-10-CM | POA: Diagnosis not present

## 2023-07-20 DIAGNOSIS — D469 Myelodysplastic syndrome, unspecified: Secondary | ICD-10-CM | POA: Diagnosis not present

## 2023-07-22 DIAGNOSIS — M1711 Unilateral primary osteoarthritis, right knee: Secondary | ICD-10-CM | POA: Diagnosis not present

## 2023-07-22 DIAGNOSIS — M25561 Pain in right knee: Secondary | ICD-10-CM | POA: Diagnosis not present

## 2023-07-22 DIAGNOSIS — G8929 Other chronic pain: Secondary | ICD-10-CM | POA: Diagnosis not present

## 2023-08-12 DIAGNOSIS — Z6828 Body mass index (BMI) 28.0-28.9, adult: Secondary | ICD-10-CM | POA: Diagnosis not present

## 2023-08-12 DIAGNOSIS — Z0000 Encounter for general adult medical examination without abnormal findings: Secondary | ICD-10-CM | POA: Diagnosis not present

## 2023-08-12 DIAGNOSIS — Z9181 History of falling: Secondary | ICD-10-CM | POA: Diagnosis not present

## 2023-08-12 DIAGNOSIS — G35 Multiple sclerosis: Secondary | ICD-10-CM | POA: Diagnosis not present

## 2023-08-12 DIAGNOSIS — Z1339 Encounter for screening examination for other mental health and behavioral disorders: Secondary | ICD-10-CM | POA: Diagnosis not present

## 2023-08-12 DIAGNOSIS — Z1331 Encounter for screening for depression: Secondary | ICD-10-CM | POA: Diagnosis not present

## 2023-09-07 DIAGNOSIS — M25561 Pain in right knee: Secondary | ICD-10-CM | POA: Diagnosis not present

## 2023-09-08 DIAGNOSIS — M25561 Pain in right knee: Secondary | ICD-10-CM | POA: Diagnosis not present

## 2023-09-08 DIAGNOSIS — M25562 Pain in left knee: Secondary | ICD-10-CM | POA: Diagnosis not present

## 2023-09-08 DIAGNOSIS — G8929 Other chronic pain: Secondary | ICD-10-CM | POA: Diagnosis not present

## 2023-09-16 DIAGNOSIS — N3281 Overactive bladder: Secondary | ICD-10-CM | POA: Diagnosis not present

## 2023-10-15 DIAGNOSIS — K219 Gastro-esophageal reflux disease without esophagitis: Secondary | ICD-10-CM | POA: Diagnosis not present

## 2023-10-15 DIAGNOSIS — I10 Essential (primary) hypertension: Secondary | ICD-10-CM | POA: Diagnosis not present

## 2023-10-15 DIAGNOSIS — E785 Hyperlipidemia, unspecified: Secondary | ICD-10-CM | POA: Diagnosis not present

## 2023-10-15 DIAGNOSIS — Z6834 Body mass index (BMI) 34.0-34.9, adult: Secondary | ICD-10-CM | POA: Diagnosis not present

## 2023-10-15 DIAGNOSIS — M1990 Unspecified osteoarthritis, unspecified site: Secondary | ICD-10-CM | POA: Diagnosis not present

## 2023-10-15 DIAGNOSIS — N3281 Overactive bladder: Secondary | ICD-10-CM | POA: Diagnosis not present

## 2023-10-15 DIAGNOSIS — E66811 Obesity, class 1: Secondary | ICD-10-CM | POA: Diagnosis not present

## 2023-10-20 DIAGNOSIS — M25561 Pain in right knee: Secondary | ICD-10-CM | POA: Diagnosis not present

## 2023-10-20 DIAGNOSIS — Z6828 Body mass index (BMI) 28.0-28.9, adult: Secondary | ICD-10-CM | POA: Diagnosis not present

## 2023-10-22 DIAGNOSIS — N3281 Overactive bladder: Secondary | ICD-10-CM | POA: Diagnosis not present

## 2023-10-22 DIAGNOSIS — Z79899 Other long term (current) drug therapy: Secondary | ICD-10-CM | POA: Diagnosis not present

## 2023-10-22 DIAGNOSIS — I10 Essential (primary) hypertension: Secondary | ICD-10-CM | POA: Diagnosis not present

## 2023-10-22 DIAGNOSIS — E785 Hyperlipidemia, unspecified: Secondary | ICD-10-CM | POA: Diagnosis not present

## 2023-10-22 DIAGNOSIS — K219 Gastro-esophageal reflux disease without esophagitis: Secondary | ICD-10-CM | POA: Diagnosis not present

## 2023-11-18 DIAGNOSIS — M1712 Unilateral primary osteoarthritis, left knee: Secondary | ICD-10-CM | POA: Diagnosis not present

## 2023-11-23 DIAGNOSIS — R159 Full incontinence of feces: Secondary | ICD-10-CM | POA: Diagnosis not present

## 2023-11-24 DIAGNOSIS — G35D Multiple sclerosis, unspecified: Secondary | ICD-10-CM | POA: Diagnosis not present

## 2023-11-25 DIAGNOSIS — M1711 Unilateral primary osteoarthritis, right knee: Secondary | ICD-10-CM | POA: Diagnosis not present

## 2023-12-14 DIAGNOSIS — M1711 Unilateral primary osteoarthritis, right knee: Secondary | ICD-10-CM | POA: Diagnosis not present

## 2024-01-11 DIAGNOSIS — M25561 Pain in right knee: Secondary | ICD-10-CM | POA: Diagnosis not present

## 2024-01-11 DIAGNOSIS — Z01818 Encounter for other preprocedural examination: Secondary | ICD-10-CM | POA: Diagnosis not present

## 2024-01-11 DIAGNOSIS — Z23 Encounter for immunization: Secondary | ICD-10-CM | POA: Diagnosis not present

## 2024-01-11 DIAGNOSIS — E099 Drug or chemical induced diabetes mellitus without complications: Secondary | ICD-10-CM | POA: Diagnosis not present

## 2024-01-11 DIAGNOSIS — T380X5A Adverse effect of glucocorticoids and synthetic analogues, initial encounter: Secondary | ICD-10-CM | POA: Diagnosis not present

## 2024-01-11 DIAGNOSIS — Z6828 Body mass index (BMI) 28.0-28.9, adult: Secondary | ICD-10-CM | POA: Diagnosis not present
